# Patient Record
Sex: Male | Born: 1995 | Race: Black or African American | Hispanic: No | Marital: Single | Smoking: Never smoker
Health system: Southern US, Community
[De-identification: ages and names within clinical notes are randomized; demographics above are authoritative.]

## PROBLEM LIST (undated history)

## (undated) HISTORY — PX: ARTHROSCOPIC REPAIR ACL: SUR80

---
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
## 2018-03-08 ENCOUNTER — Emergency Department
Admission: EM | Admit: 2018-03-08 | Discharge: 2018-03-08 | Disposition: A | Discharge status: Home / Self Care | Payer: BLUE CROSS/BLUE SHIELD | Attending: Emergency Medicine | Admitting: Emergency Medicine

## 2018-03-08 ENCOUNTER — Other Ambulatory Visit: Payer: Self-pay

## 2018-03-08 DIAGNOSIS — Z79899 Other long term (current) drug therapy: Secondary | ICD-10-CM | POA: Insufficient documentation

## 2018-03-08 DIAGNOSIS — J069 Acute upper respiratory infection, unspecified: Secondary | ICD-10-CM | POA: Insufficient documentation

## 2018-03-08 MED ORDER — IBUPROFEN 400 MG PO TABS: 400.0000 mg | ORAL_TABLET | Freq: Four times a day (QID) | ORAL | 0 refills | Status: DC | PRN

## 2018-03-08 MED ORDER — PSEUDOEPH-BROMPHEN-DM 30-2-10 MG/5ML PO SYRP: 10.0000 mL | ORAL_SOLUTION | Freq: Four times a day (QID) | ORAL | 0 refills | Status: DC | PRN

## 2018-03-08 NOTE — ED Notes (Signed)
Pt reports headache and runny nose for 2-3 days; pt in no acute distress; ambulatory with steady gait

## 2018-03-08 NOTE — Discharge Instructions (Signed)
Follow up with the primary care provider of your choice if not improving over the next few days. Return to the ER for symptoms that change or worsen if unable to schedule an appointment.

## 2018-03-08 NOTE — ED Notes (Signed)
Pt c/o not feeling well x3 days with a sore throat, n/v and generalized body aches. Pt reports 1 episode of emesis in the last 24 hours. Pt states he has been coughing up yellow sputum. Pt denies any diarrhea.

## 2018-03-08 NOTE — ED Triage Notes (Signed)
Pt states "I have a cold and my head hurts". Pt states has had a runny nose and one episode of emesis today. Pt appears in no acute distress. No diarrhea per pt. Pt states he took a benadryl today at unknown time.

## 2018-03-08 NOTE — ED Provider Notes (Signed)
[facility] Emergency Department Provider Note  ____________________________________________  Time seen: Approximately 7:51 PM  I have reviewed the triage vital signs and the nursing notes.   HISTORY  Chief Complaint URI and Headache   HPI Larry Walton is a 22 y.o. male who presents to the emergency department for treatment of cold symptoms and headache.  Symptoms started 2 days ago.  He has taken Benadryl without relief.  No past medical history on file.  There are no active problems to display for this patient.   Past Surgical History:  Procedure Laterality Date  . ARTHROSCOPIC REPAIR ACL     left    Prior to Admission medications   Medication Sig Start Date End Date Taking? Authorizing Provider  brompheniramine-pseudoephedrine-DM 30-2-10 MG/5ML syrup Take 10 mLs by mouth 4 (four) times daily as needed. 03/08/18   Dorinda Stehr B, FNP  ibuprofen (ADVIL,MOTRIN) 400 MG tablet Take 1 tablet (400 mg total) by mouth every 6 (six) hours as needed. 03/08/18   Teofilo Lupinacci B, FNP  predniSONE (DELTASONE) 50 MG tablet Take 1 tablet (50 mg total) by mouth daily. 08/06/15   Isa Rankin, MD    Allergies Patient has no known allergies.  No family history on file.  Social History Social History   Tobacco Use  . Smoking status: Never Smoker  Substance Use Topics  . Alcohol use: No  . Drug use: No    Review of Systems Constitutional: Negative for fever/chills ENT: Positive for sore throat. Cardiovascular: Denies chest pain. Respiratory: Negative for shortness of breath.  Positive for cough. Gastrointestinal: Positive nausea, one episode of vomiting.  No diarrhea.  Musculoskeletal: Negative for body aches Skin: Negative for rash. Neurological: Positive for headaches ____________________________________________   PHYSICAL EXAM:  VITAL SIGNS: ED Triage Vitals [03/08/18 1929]  Enc Vitals Group     BP 128/75     Pulse Rate 78     Resp 16    Temp 98.6 F (37 C)     Temp Source Oral     SpO2 100 %     Weight 160 lb (72.6 kg)     Height  (1.753 m)     Head Circumference      Peak Flow      Pain Score 2     Pain Loc      Pain Edu?      Excl. in GC?     Constitutional: Alert and oriented.  Acutely ill appearing and in no acute distress. Eyes: Conjunctivae are normal. EOMI. Ears: Bilateral tympanic membranes are normal Nose: Sinus congestion noted; clear rhinnorhea. Mouth/Throat: Mucous membranes are moist.  Oropharynx mildly erythematous. Tonsils flat. Neck: No stridor.  Lymphatic: No cervical lymphadenopathy. Cardiovascular: Normal rate, regular rhythm. Good peripheral circulation. Respiratory: Normal respiratory effort.  No retractions.  Breath sounds clear to auscultation throughout. Gastrointestinal: Soft and nontender.  Musculoskeletal: FROM x 4 extremities.  Neurologic:  Normal speech and language.  Skin:  Skin is warm, dry and intact. No rash noted. Psychiatric: Mood and affect are normal. Speech and behavior are normal.  ____________________________________________   LABS (all labs ordered are listed, but only abnormal results are displayed)  Labs Reviewed - No data to display ____________________________________________  EKG  Not indicated ____________________________________________  RADIOLOGY  None indicated ____________________________________________   PROCEDURES  Procedure(s) performed: None  Critical Care performed: No ____________________________________________   INITIAL IMPRESSION / ASSESSMENT AND PLAN / ED COURSE  22 y.o. male who presents to the emergency  department for treatment and evaluation of symptoms and exam most consistent with a viral upper respiratory infection.  He was given prescriptions for ibuprofen and Bromfed.  He is instructed to follow-up with the primary care provider of his choice for symptoms that are not improving over the next few days.  Work note was  provided for today and tomorrow as well.   He was encouraged to return to the ER for symptoms that change or worsen if unable to schedule an appointment.  Medications - No data to display  ED Discharge Orders        Ordered    ibuprofen (ADVIL,MOTRIN) 400 MG tablet  Every 6 hours PRN     03/08/18 2011    brompheniramine-pseudoephedrine-DM 30-2-10 MG/5ML syrup  4 times daily PRN     03/08/18 2011       Pertinent labs & imaging results that were available during my care of the patient were reviewed by me and considered in my medical decision making (see chart for details).    If controlled substance prescribed during this visit, 12 month history viewed on the [facility] prior to issuing an initial prescription for Schedule II or III opiod. ____________________________________________   FINAL CLINICAL IMPRESSION(S) / ED DIAGNOSES  Final diagnoses:  Viral upper respiratory tract infection    Note:  This document was prepared using Dragon voice recognition software and may include unintentional dictation errors.     Chinita Pester, FNP 03/08/18 2044    Sharyn Creamer, MD 03/09/18 [phone]

## 2018-08-04 ENCOUNTER — Other Ambulatory Visit: Payer: Self-pay

## 2018-08-04 ENCOUNTER — Encounter: Payer: Self-pay | Admitting: Emergency Medicine

## 2018-08-04 ENCOUNTER — Emergency Department
Admission: EM | Admit: 2018-08-04 | Discharge: 2018-08-05 | Disposition: A | Discharge status: Other Acute Inpatient Hospital | Payer: Self-pay | Attending: Emergency Medicine | Admitting: Emergency Medicine

## 2018-08-04 DIAGNOSIS — F29 Unspecified psychosis not due to a substance or known physiological condition: Secondary | ICD-10-CM | POA: Insufficient documentation

## 2018-08-04 LAB — COMPREHENSIVE METABOLIC PANEL
ALT: 113 U/L — ABNORMAL HIGH (ref 0–44)
AST: 215 U/L — ABNORMAL HIGH (ref 15–41)
Albumin: 4.2 g/dL (ref 3.5–5.0)
Alkaline Phosphatase: 48 U/L (ref 38–126)
Anion gap: 9 (ref 5–15)
BILIRUBIN TOTAL: 1 mg/dL (ref 0.3–1.2)
BUN: 19 mg/dL (ref 6–20)
CHLORIDE: 107 mmol/L (ref 98–111)
CO2: 26 mmol/L (ref 22–32)
CREATININE: 1.41 mg/dL — AB (ref 0.61–1.24)
Calcium: 9 mg/dL (ref 8.9–10.3)
Glucose, Bld: 96 mg/dL (ref 70–99)
Potassium: 4 mmol/L (ref 3.5–5.1)
Sodium: 142 mmol/L (ref 135–145)
TOTAL PROTEIN: 6.7 g/dL (ref 6.5–8.1)

## 2018-08-04 LAB — CBC WITH DIFFERENTIAL/PLATELET
Basophils Absolute: 0 10*3/uL (ref 0–0.1)
Basophils Relative: 0 %
EOS ABS: 0 10*3/uL (ref 0–0.7)
EOS PCT: 0 %
HCT: 38.3 % — ABNORMAL LOW (ref 40.0–52.0)
HEMOGLOBIN: 12.6 g/dL — AB (ref 13.0–18.0)
Lymphocytes Relative: 14 %
Lymphs Abs: 1 10*3/uL (ref 1.0–3.6)
MCH: 27.9 pg (ref 26.0–34.0)
MCHC: 33 g/dL (ref 32.0–36.0)
MCV: 84.5 fL (ref 80.0–100.0)
MONOS PCT: 8 %
Monocytes Absolute: 0.6 10*3/uL (ref 0.2–1.0)
NEUTROS PCT: 78 %
Neutro Abs: 5.8 10*3/uL (ref 1.4–6.5)
PLATELETS: 171 10*3/uL (ref 150–440)
RBC: 4.53 MIL/uL (ref 4.40–5.90)
RDW: 13.9 % (ref 11.5–14.5)
WBC: 7.5 10*3/uL (ref 3.8–10.6)

## 2018-08-04 LAB — URINE DRUG SCREEN, QUALITATIVE (ARMC ONLY)
AMPHETAMINES, UR SCREEN: NOT DETECTED
BARBITURATES, UR SCREEN: NOT DETECTED
Benzodiazepine, Ur Scrn: NOT DETECTED
Cannabinoid 50 Ng, Ur ~~LOC~~: POSITIVE — AB
Cocaine Metabolite,Ur ~~LOC~~: NOT DETECTED
MDMA (Ecstasy)Ur Screen: NOT DETECTED
METHADONE SCREEN, URINE: NOT DETECTED
OPIATE, UR SCREEN: NOT DETECTED
Phencyclidine (PCP) Ur S: NOT DETECTED
Tricyclic, Ur Screen: NOT DETECTED

## 2018-08-04 LAB — SALICYLATE LEVEL: Salicylate Lvl: <7 mg/dL (ref 2.8–30.0)

## 2018-08-04 LAB — URINALYSIS, COMPLETE (UACMP) WITH MICROSCOPIC
BILIRUBIN URINE: NEGATIVE
Bacteria, UA: NONE SEEN
Glucose, UA: NEGATIVE mg/dL
Hgb urine dipstick: NEGATIVE
Ketones, ur: NEGATIVE mg/dL
Leukocytes, UA: NEGATIVE
Nitrite: NEGATIVE
Protein, ur: NEGATIVE mg/dL
Specific Gravity, Urine: 1.021 (ref 1.005–1.030)
pH: 6 (ref 5.0–8.0)

## 2018-08-04 LAB — ETHANOL: Alcohol, Ethyl (B): <10 mg/dL (ref <10)

## 2018-08-04 LAB — ACETAMINOPHEN LEVEL

## 2018-08-04 MED ORDER — LORAZEPAM 2 MG/ML IJ SOLN
2.0000 mg | Freq: Once | INTRAMUSCULAR | Status: AC
  Administered 2018-08-04: 2 mg via INTRAMUSCULAR

## 2018-08-04 MED ORDER — HALOPERIDOL LACTATE 5 MG/ML IJ SOLN
INTRAMUSCULAR | Status: AC
  Administered 2018-08-04: 5 mg via INTRAMUSCULAR
  Filled 2018-08-04: qty 1

## 2018-08-04 MED ORDER — LIDOCAINE HCL URETHRAL/MUCOSAL 2 % EX GEL
CUTANEOUS | Status: AC
  Filled 2018-08-04: qty 10

## 2018-08-04 MED ORDER — HALOPERIDOL LACTATE 5 MG/ML IJ SOLN
5.0000 mg | Freq: Once | INTRAMUSCULAR | Status: AC
  Administered 2018-08-04: 5 mg via INTRAMUSCULAR

## 2018-08-04 MED ORDER — LORAZEPAM 2 MG/ML IJ SOLN
INTRAMUSCULAR | Status: AC
  Administered 2018-08-04: 2 mg via INTRAMUSCULAR
  Filled 2018-08-04: qty 1

## 2018-08-04 NOTE — ED Notes (Signed)
Patient belongings black book-bag, pair of [employer] sandals, yellow shorts, navy blue shirt, bag of hygiene (patient has 4 bags total in the [employer])

## 2018-08-04 NOTE — ED Notes (Signed)
TTS at bedside. 

## 2018-08-04 NOTE — ED Notes (Signed)
TTS attempted to conduct assessment now that he is awake.  Larry Walton was incoherent and tried to answer questions, but was unable to complete his thoughts, rambled to other topices and did not appear able to follow the thread of conversation.

## 2018-08-04 NOTE — ED Notes (Signed)
Received iphone cell phone, brown wallet, black bracelet  (bag 2 of 2)

## 2018-08-04 NOTE — ED Notes (Signed)
Pt. Transferred to BHU from ED to room after screening for contraband. Report to include Situation, Background, Assessment and Recommendations from [facility]. Pt. Oriented to unit including Q15 minute rounds as well as the security cameras for their protection. Patient is alert and oriented, warm and dry in no acute distress. Patient denies SI, HI, and AH. Patient states he sees "spiders". Pt. Encouraged to let me know if needs arise.

## 2018-08-04 NOTE — ED Notes (Signed)
Patient able to get up and go to restroom and give a urine sample. Patient appropriate and cooperative

## 2018-08-04 NOTE — ED Notes (Addendum)
Patient given Ativan 2mg  IM, Haldol 5mg  IM patient came in with police handcuffed hyperactive and bizarre behavior. When questioned what is going on today states he can "see into another space". Flight of ideas. Patient keeps attempting to get up and leave.

## 2018-08-04 NOTE — ED Notes (Signed)
Patient belongings are black shorts, one pair black socks, black underwear placed in BHU closet

## 2018-08-04 NOTE — ED Notes (Signed)
Unable to assess patient for any medical issues due to bizarre behavior

## 2018-08-04 NOTE — ED Provider Notes (Addendum)
[facility] Emergency Department Provider Note   ____________________________________________   None    (approximate)  I have reviewed the triage vital signs and the nursing notes.   HISTORY  Chief Complaint Medical Clearance  Chief complaint is acting weird HPI Larry Walton Larry Walton Walton is a 22 y.o. male she brought in by police for usual activity.  He is tickling himself and and talking to himself like there are 2 people in his body for the police here in the emergency room the patient is still talking to himself as though there were 2 people in his body.  He is holding his breath repeatedly for long periods of time but he will not follow commands he does not make any sense when he responds to my questions and occasionally he will talking gibberish.  He is responding to internal stimuli.  I cannot get a history out of him.  Only thing that he will say that I can understand is that he does not smoke cigarettes.  The way he says it implies he smokes other things.  History reviewed. No pertinent past medical history.  Patient Active Problem List   Diagnosis Date Noted  . Severe manic bipolar 1 disorder with psychotic behavior (HCC) 08/06/2018    Past Surgical History:  Procedure Laterality Date  . ARTHROSCOPIC REPAIR ACL     left    Prior to Admission medications   Medication Sig Start Date End Date Taking? Authorizing Provider  brompheniramine-pseudoephedrine-DM 30-2-10 MG/5ML syrup Take 10 mLs by mouth 4 (four) times daily as needed. Patient not taking: Reported on 08/04/2018 03/08/18   Kem Boroughs B, FNP  ibuprofen (ADVIL,MOTRIN) 400 MG tablet Take 1 tablet (400 mg total) by mouth every 6 (six) hours as needed. Patient not taking: Reported on 08/04/2018 03/08/18   Kem Boroughs B, FNP  predniSONE (DELTASONE) 50 MG tablet Take 1 tablet (50 mg total) by mouth daily. Patient not taking: Reported on 08/04/2018 08/06/15   Isa Rankin, MD    Allergies Patient  has no known allergies.  No family history on file.  Social History Social History   Tobacco Use  . Smoking status: Never Smoker  . Smokeless tobacco: Never Used  Substance Use Topics  . Alcohol use: No  . Drug use: No    Review of Systems Unable to obtain  ____________________________________________   PHYSICAL EXAM:  VITAL SIGNS: ED Triage Vitals  Enc Vitals Group     BP      Pulse      Resp      Temp      Temp src      SpO2      Weight      Height      Head Circumference      Peak Flow      Pain Score      Pain Loc      Pain Edu?      Excl. in GC?     Constitutional: Alert looks healthy but does not make any sense when he speaks Eyes: Conjunctivae are normal. PER. EOMI. Head: Atraumatic. Nose: No congestion/rhinnorhea. Mouth/Throat: Mucous membranes are moist.  Oropharynx non-erythematous. Neck: No stridor. Cardiovascular: Normal rate, regular rhythm. Grossly normal heart sounds.  Good peripheral circulation. Respiratory: Normal respiratory effort.  No retractions. Lungs CTAB. Gastrointestinal: Soft and nontender. No distention. No abdominal bruits. No CVA tenderness. Musculoskeletal: No lower extremity tenderness nor edema.  . Neurologic: Is all extremities equally and well does not  make any sense most of the time he is talking.  He does say he does not smoke cigarettes as though he smokes something else he will not see if he vapes or uses any other drugs or medications he will not say if he has any medical problems he is holding his breath intermittently and then breathing very fast when he can hold it any longer he is moving irregularly in the chair but purposefully Skin:  Skin is warm, dry and intact. No rash noted.   ____________________________________________   LABS (all labs ordered are listed, but only abnormal results are displayed)  Labs Reviewed  URINALYSIS, COMPLETE (UACMP) WITH MICROSCOPIC - Abnormal; Notable for the following components:       Result Value   Color, Urine YELLOW (*)    APPearance CLEAR (*)    All other components within normal limits  URINE DRUG SCREEN, QUALITATIVE ([facility] ONLY) - Abnormal; Notable for the following components:   Cannabinoid 50 Ng, Ur [name] POSITIVE (*)    All other components within normal limits  COMPREHENSIVE METABOLIC PANEL - Abnormal; Notable for the following components:   Creatinine, Ser 1.41 (*)    AST 215 (*)    ALT 113 (*)    All other components within normal limits  ACETAMINOPHEN LEVEL - Abnormal; Notable for the following components:   Acetaminophen (Tylenol), Serum <10 (*)    All other components within normal limits  CBC WITH DIFFERENTIAL/PLATELET - Abnormal; Notable for the following components:   Hemoglobin 12.6 (*)    HCT 38.3 (*)    All other components within normal limits  ETHANOL  SALICYLATE LEVEL   ____________________________________________  EKG EKG read and interpreted by me shows sinus bradycardia rate of 52 normal axis no other acute changes seen ________________________________________  RADIOLOGY  ED MD interpretation:    Official radiology report(s): No results found.  ____________________________________________   PROCEDURES  Procedure(s) performed:   Procedures  Critical Care performed:   ____________________________________________   INITIAL IMPRESSION / ASSESSMENT AND PLAN / ED COURSE  Patient with psychosis.  Seems to be acute.  He has a drug screen positive for marijuana.  This may be the cause of the whole problem.  Otherwise he is medically clear.  Psychiatry sees him and will admit.         ____________________________________________   FINAL CLINICAL IMPRESSION(S) / ED DIAGNOSES  Final diagnoses:  Psychosis, unspecified psychosis type [facility])     ED Discharge Orders    None       Note:  This document was prepared using Dragon voice recognition software and may include unintentional dictation errors.     Arnaldo Natal, MD 08/08/18 1439    Arnaldo Natal, MD 08/19/18 [phone]

## 2018-08-04 NOTE — ED Triage Notes (Signed)
Arrives with police, hyperactive and bizarre behavior. When questioned what is going on today states he can "see into another space". Flight of ideas. Does not answer if he has been using recreational drugs.

## 2018-08-04 NOTE — BH Assessment (Signed)
Pt not medically cleared for assessment.

## 2018-08-05 ENCOUNTER — Inpatient Hospital Stay
Admission: AD | Admit: 2018-08-05 | Discharge: 2018-08-14 | DRG: 885 | Disposition: A | Discharge status: Home / Self Care | Payer: BLUE CROSS/BLUE SHIELD | Source: Intra-hospital | Attending: Psychiatry | Admitting: Psychiatry

## 2018-08-05 ENCOUNTER — Other Ambulatory Visit: Payer: Self-pay

## 2018-08-05 DIAGNOSIS — R748 Abnormal levels of other serum enzymes: Secondary | ICD-10-CM | POA: Diagnosis present

## 2018-08-05 DIAGNOSIS — F1210 Cannabis abuse, uncomplicated: Secondary | ICD-10-CM | POA: Diagnosis present

## 2018-08-05 DIAGNOSIS — F259 Schizoaffective disorder, unspecified: Secondary | ICD-10-CM

## 2018-08-05 DIAGNOSIS — Z5181 Encounter for therapeutic drug level monitoring: Secondary | ICD-10-CM | POA: Diagnosis not present

## 2018-08-05 DIAGNOSIS — F312 Bipolar disorder, current episode manic severe with psychotic features: Secondary | ICD-10-CM | POA: Diagnosis not present

## 2018-08-05 DIAGNOSIS — R4689 Other symptoms and signs involving appearance and behavior: Secondary | ICD-10-CM | POA: Diagnosis present

## 2018-08-05 DIAGNOSIS — Z590 Homelessness: Secondary | ICD-10-CM | POA: Diagnosis not present

## 2018-08-05 DIAGNOSIS — R4587 Impulsiveness: Secondary | ICD-10-CM | POA: Diagnosis present

## 2018-08-05 DIAGNOSIS — F250 Schizoaffective disorder, bipolar type: Secondary | ICD-10-CM | POA: Diagnosis not present

## 2018-08-05 DIAGNOSIS — F2081 Schizophreniform disorder: Secondary | ICD-10-CM | POA: Diagnosis present

## 2018-08-05 MED ORDER — HYDROXYZINE HCL 50 MG PO TABS
50.0000 mg | ORAL_TABLET | Freq: Three times a day (TID) | ORAL | Status: DC | PRN
  Administered 2018-08-09: 50 mg via ORAL
  Filled 2018-08-05 (×3): qty 1

## 2018-08-05 MED ORDER — RISPERIDONE 1 MG PO TABS
1.0000 mg | ORAL_TABLET | Freq: Every day | ORAL | Status: DC
  Administered 2018-08-05: 1 mg via ORAL
  Filled 2018-08-05: qty 1

## 2018-08-05 MED ORDER — ACETAMINOPHEN 325 MG PO TABS
650.0000 mg | ORAL_TABLET | Freq: Four times a day (QID) | ORAL | Status: DC | PRN
  Administered 2018-08-09: 650 mg via ORAL
  Filled 2018-08-05: qty 2

## 2018-08-05 MED ORDER — ALUM & MAG HYDROXIDE-SIMETH 200-200-20 MG/5ML PO SUSP: 30.0000 mL | ORAL | Status: DC | PRN

## 2018-08-05 MED ORDER — MAGNESIUM HYDROXIDE 400 MG/5ML PO SUSP: 30.0000 mL | Freq: Every day | ORAL | Status: DC | PRN

## 2018-08-05 MED ORDER — TRAZODONE HCL 100 MG PO TABS
100.0000 mg | ORAL_TABLET | Freq: Every evening | ORAL | Status: DC | PRN
  Administered 2018-08-05 – 2018-08-10 (×3): 100 mg via ORAL
  Filled 2018-08-05 (×4): qty 1

## 2018-08-05 NOTE — Tx Team (Signed)
Initial Treatment Plan 08/05/2018 3:27 PM Larry Walton [id number]    PATIENT STRESSORS: Financial difficulties Health problems Substance abuse   PATIENT STRENGTHS: Ability for insight Capable of independent living Communication skills   PATIENT IDENTIFIED PROBLEMS:  Psychotic 08/05/18  Homeless 08/05/18  Substance Abuse 08/05/18                 DISCHARGE CRITERIA:  Ability to meet basic life and health needs Improved stabilization in mood, thinking, and/or behavior  PRELIMINARY DISCHARGE PLAN: Outpatient therapy Return to previous living arrangement  PATIENT/FAMILY INVOLVEMENT: This treatment plan has been presented to and reviewed with the patient, [name], and/or family member,  .  The patient and family have been given the opportunity to ask questions and make suggestions.  Crist Infante, RN 08/05/2018, 3:27 PM

## 2018-08-05 NOTE — ED Notes (Signed)
Hourly rounding reveals patient sleeping in room. No complaints, stable, in no acute distress. Q15 minute rounds and monitoring via Security Cameras to continue. 

## 2018-08-05 NOTE — Plan of Care (Signed)
New admission   patient unable to completely  understand information received  will continue to redirect   Problem: Education: Goal: Knowledge of [facility] Education information/materials will improve Outcome: Progressing Goal: Emotional status will improve Outcome: Progressing Goal: Mental status will improve Outcome: Progressing Goal: Verbalization of understanding the information provided will improve Outcome: Progressing   Problem: Coping: Goal: Coping ability will improve Outcome: Progressing Goal: Will verbalize feelings Outcome: Progressing   Problem: Safety: Goal: Ability to redirect hostility and anger into socially appropriate behaviors will improve Outcome: Progressing Goal: Ability to remain free from injury will improve Outcome: Progressing
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
## 2018-08-05 NOTE — ED Notes (Addendum)
Patient is being transferred to [facility] room 305A. Patient and staff received belongings and verbalized he has received all of his belongings. Patient appropriate and cooperative, Denies SI/HI AVH. Vital signs taken. NAD noted.

## 2018-08-05 NOTE — ED Provider Notes (Signed)
-----------------------------------------   6:32 AM on 08/05/2018 -----------------------------------------   Blood pressure (!) 104/51, pulse 63, temperature 98.7 F (37.1 C), temperature source Oral, resp. rate 18, height 1.778 m (5\' 10" ), weight 74.8 kg, SpO2 100 %.  The patient had no acute events since last update.  Psychiatric/TTS consult has not yet been performed because the patient was uncooperative.   Loleta Rose, MD 08/05/18 [phone]

## 2018-08-05 NOTE — ED Notes (Signed)
Writer attempted to speak with patient, patient continues to be somewhat incoherent and attempted to answer questions, but was unable to complete his thoughts let him know he will talk with the psychiatrist.

## 2018-08-05 NOTE — Consult Note (Signed)
[facility] Face-to-Face Psychiatry Consult   Reason for Consult: Consult for this 22 year old man who was brought in by police after being found acting bizarrely in public Referring Physician: Alfred Levins Patient Identification: Larry Walton MRN:  [id number] Principal Diagnosis: Schizophreniform disorder [facility]) Diagnosis:   Patient Active Problem List   Diagnosis Date Noted  . Schizophreniform disorder ([place]) [F20.81] 08/05/2018    Total Time spent with patient: 1 hour  Subjective:   Larry Walton is a 22 y.o. male patient admitted with "the police brought me".  HPI: Patient interviewed chart reviewed.  22 year old man brought in by police with reports that he had been acting bizarrely in public.  The patient says that he was at the [facility] and that he had no shirt on and that he felt like there was a spider on him.  That makes it sound more organized than he actually told it which was very disorganized and difficult to make much sense of.  Patient seems confused and did not really understand what is going on around him.  He says he has been staying at the shelter for the past 2 weeks and prior to that had been staying with his mother.  Denies that he is using any alcohol or drugs.  Says that he only drinks "occasionally" but then clarifies that he is not even talking about alcohol.  Patient is vague when asked about hallucinations ultimately denies it but seems to be on the fence about it.  A lot of his speech is disorganized rambling and very soft.  Social history: Not much information available.  Apparently staying at the shelter for the last couple weeks.  Not working right now.  Michela Pitcher that he been looking for work.  Medical history: Does not know of any medical problems  Substance abuse history: Denies alcohol or drug abuse history no findings on the alcohol or drug screen  Past Psychiatric History: Patient denies having any psychiatric history as an adult.  He says that when he was in second grade he saw a  psychiatrist.  He is very vague about what that was for.  Denies ever having tried to kill himself in the past or having any history of violence.  Risk to Self:   Risk to Others:   Prior Inpatient Therapy:   Prior Outpatient Therapy:    Past Medical History: No past medical history on file.  Past Surgical History:  Procedure Laterality Date  . ARTHROSCOPIC REPAIR ACL     left   Family History: No family history on file. Family Psychiatric  History: Does not know of any family history Social History:  Social History   Substance and Sexual Activity  Alcohol Use No     Social History   Substance and Sexual Activity  Drug Use No    Social History   Socioeconomic History  . Marital status: Single    Spouse name: Not on file  . Number of children: Not on file  . Years of education: Not on file  . Highest education level: Not on file  Occupational History  . Not on file  Social Needs  . Financial resource strain: Not on file  . Food insecurity:    Worry: Not on file    Inability: Not on file  . Transportation needs:    Medical: Not on file    Non-medical: Not on file  Tobacco Use  . Smoking status: Never Smoker  Substance and Sexual Activity  . Alcohol use: No  . Drug  use: No  . Sexual activity: Not on file  Lifestyle  . Physical activity:    Days per week: Not on file    Minutes per session: Not on file  . Stress: Not on file  Relationships  . Social connections:    Talks on phone: Not on file    Gets together: Not on file    Attends religious service: Not on file    Active member of club or organization: Not on file    Attends meetings of clubs or organizations: Not on file    Relationship status: Not on file  Other Topics Concern  . Not on file  Social History Narrative  . Not on file   Additional Social History:    Allergies:  No Known Allergies  Labs:  Results for orders placed or performed during the hospital encounter of 08/04/18 (from the  past 48 hour(s))  Urinalysis, Complete w Microscopic     Status: Abnormal   Collection Time: 08/04/18  2:37 PM  Result Value Ref Range   Color, Urine YELLOW (A) YELLOW   APPearance CLEAR (A) CLEAR   Specific Gravity, Urine 1.021 1.005 - 1.030   pH 6.0 5.0 - 8.0   Glucose, UA NEGATIVE NEGATIVE mg/dL   Hgb urine dipstick NEGATIVE NEGATIVE   Bilirubin Urine NEGATIVE NEGATIVE   Ketones, ur NEGATIVE NEGATIVE mg/dL   Protein, ur NEGATIVE NEGATIVE mg/dL   Nitrite NEGATIVE NEGATIVE   Leukocytes, UA NEGATIVE NEGATIVE   RBC / HPF 0-5 0 - 5 RBC/hpf   WBC, UA 0-5 0 - 5 WBC/hpf   Bacteria, UA NONE SEEN NONE SEEN   Squamous Epithelial / LPF 0-5 0 - 5   Mucus PRESENT    Hyaline Casts, UA PRESENT     Comment: Performed at [facility], [address] [place], [place] [address]  Urine Drug Screen, Qualitative     Status: Abnormal   Collection Time: 08/04/18  2:37 PM  Result Value Ref Range   Tricyclic, Ur Screen NONE DETECTED NONE DETECTED   Amphetamines, Ur Screen NONE DETECTED NONE DETECTED   MDMA (Ecstasy)Ur Screen NONE DETECTED NONE DETECTED   Cocaine Metabolite,Ur [place] NONE DETECTED NONE DETECTED   Opiate, Ur Screen NONE DETECTED NONE DETECTED   Phencyclidine (PCP) Ur S NONE DETECTED NONE DETECTED   Cannabinoid 50 Ng, Ur [place] POSITIVE (A) NONE DETECTED   Barbiturates, Ur Screen NONE DETECTED NONE DETECTED   Benzodiazepine, Ur Scrn NONE DETECTED NONE DETECTED   Methadone Scn, Ur NONE DETECTED NONE DETECTED    Comment: (NOTE) Tricyclics + metabolites, urine    Cutoff 1000 ng/mL Amphetamines + metabolites, urine  Cutoff 1000 ng/mL MDMA (Ecstasy), urine              Cutoff 500 ng/mL Cocaine Metabolite, urine          Cutoff 300 ng/mL Opiate + metabolites, urine        Cutoff 300 ng/mL Phencyclidine (PCP), urine         Cutoff 25 ng/mL Cannabinoid, urine                 Cutoff 50 ng/mL Barbiturates + metabolites, urine  Cutoff 200 ng/mL Benzodiazepine, urine              Cutoff 200  ng/mL Methadone, urine                   Cutoff 300 ng/mL The urine drug screen provides only a preliminary, unconfirmed  analytical test result and should not be used for non-medical purposes. Clinical consideration and professional judgment should be applied to any positive drug screen result due to possible interfering substances. A more specific alternate chemical method must be used in order to obtain a confirmed analytical result. Gas chromatography / mass spectrometry (GC/MS) is the preferred confirmat ory method. Performed at [facility], [address] [place], [place] [address]   Comprehensive metabolic panel     Status: Abnormal   Collection Time: 08/04/18  2:37 PM  Result Value Ref Range   Sodium 142 135 - 145 mmol/L   Potassium 4.0 3.5 - 5.1 mmol/L   Chloride 107 98 - 111 mmol/L   CO2 26 22 - 32 mmol/L   Glucose, Bld 96 70 - 99 mg/dL   BUN 19 6 - 20 mg/dL   Creatinine, Ser 1.41 (H) 0.61 - 1.24 mg/dL   Calcium 9.0 8.9 - 10.3 mg/dL   Total Protein 6.7 6.5 - 8.1 g/dL   Albumin 4.2 3.5 - 5.0 g/dL   AST 215 (H) 15 - 41 U/L   ALT 113 (H) 0 - 44 U/L   Alkaline Phosphatase 48 38 - 126 U/L   Total Bilirubin 1.0 0.3 - 1.2 mg/dL   GFR calc non Af Amer >60 >60 mL/min   GFR calc Af Amer >60 >60 mL/min    Comment: (NOTE) The eGFR has been calculated using the CKD EPI equation. This calculation has not been validated in all clinical situations. eGFR's persistently <60 mL/min signify possible Chronic Kidney Disease.    Anion gap 9 5 - 15    Comment: Performed at [facility], [place], [place] [address]  Acetaminophen level     Status: Abnormal   Collection Time: 08/04/18  2:37 PM  Result Value Ref Range   Acetaminophen (Tylenol), Serum <10 (L) 10 - 30 ug/mL    Comment: (NOTE) Therapeutic concentrations vary significantly. A range of 10-30 ug/mL  may be an effective concentration for many patients. However, some  are best treated at  concentrations outside of this range. Acetaminophen concentrations >150 ug/mL at 4 hours after ingestion  and >50 ug/mL at 12 hours after ingestion are often associated with  toxic reactions. Performed at [facility], [address] [place], [place] [address]   Ethanol     Status: None   Collection Time: 08/04/18  2:37 PM  Result Value Ref Range   Alcohol, Ethyl (B) <10 <10 mg/dL    Comment: (NOTE) Lowest detectable limit for serum alcohol is 10 mg/dL. For medical purposes only. Performed at [facility], [address] [place], [place] [address]   Salicylate level     Status: None   Collection Time: 08/04/18  2:37 PM  Result Value Ref Range   Salicylate Lvl <3.4 2.8 - 30.0 mg/dL    Comment: Performed at [facility], [address] [place], [place] [address]  CBC with Differential     Status: Abnormal   Collection Time: 08/04/18  2:37 PM  Result Value Ref Range   WBC 7.5 3.8 - 10.6 K/uL   RBC 4.53 4.40 - 5.90 MIL/uL   Hemoglobin 12.6 (L) 13.0 - 18.0 g/dL   HCT 38.3 (L) 40.0 - 52.0 %   MCV 84.5 80.0 - 100.0 fL   MCH 27.9 26.0 - 34.0 pg   MCHC 33.0 32.0 - 36.0 g/dL   RDW 13.9 11.5 - 14.5 %   Platelets 171  150 - 440 K/uL   Neutrophils Relative % 78 %   Neutro Abs 5.8 1.4 - 6.5 K/uL   Lymphocytes Relative 14 %   Lymphs Abs 1.0 1.0 - 3.6 K/uL   Monocytes Relative 8 %   Monocytes Absolute 0.6 0.2 - 1.0 K/uL   Eosinophils Relative 0 %   Eosinophils Absolute 0.0 0 - 0.7 K/uL   Basophils Relative 0 %   Basophils Absolute 0.0 0 - 0.1 K/uL    Comment: Performed at [facility], [address], [place], [place] [address]    Current Facility-Administered Medications  Medication Dose Route Frequency Provider Last Rate Last Dose  . acetaminophen (TYLENOL) tablet 650 mg  650 mg Oral Q6H PRN Clapacs, John T, MD      . alum & mag hydroxide-simeth (MAALOX/MYLANTA) 200-200-20 MG/5ML suspension 30 mL  30 mL Oral Q4H PRN Clapacs, John T, MD       . hydrOXYzine (ATARAX/VISTARIL) tablet 50 mg  50 mg Oral TID PRN Clapacs, John T, MD      . magnesium hydroxide (MILK OF MAGNESIA) suspension 30 mL  30 mL Oral Daily PRN Clapacs, John T, MD      . risperiDONE (RISPERDAL) tablet 1 mg  1 mg Oral QHS Clapacs, John T, MD      . traZODone (DESYREL) tablet 100 mg  100 mg Oral QHS PRN Clapacs, Madie Reno, MD        Musculoskeletal: Strength & Muscle Tone: within normal limits Gait & Station: normal Patient leans: N/A  Psychiatric Specialty Exam: Physical Exam  Nursing note and vitals reviewed. Constitutional: He appears well-developed and well-nourished.  HENT:  Head: Normocephalic and atraumatic.  Eyes: Pupils are equal, round, and reactive to light. Conjunctivae are normal.  Neck: Normal range of motion.  Cardiovascular: Regular rhythm and normal heart sounds.  Respiratory: Effort normal. No respiratory distress.  GI: Soft.  Musculoskeletal: Normal range of motion.  Neurological: He is alert.  Skin: Skin is warm and dry.  Psychiatric: His affect is blunt. His speech is delayed. He is withdrawn. He is not agitated and not aggressive. Thought content is not paranoid. Cognition and memory are impaired. He expresses impulsivity and inappropriate judgment. He expresses no homicidal and no suicidal ideation. He is noncommunicative. He exhibits abnormal recent memory.    Review of Systems  Constitutional: Negative.   HENT: Negative.   Eyes: Negative.   Respiratory: Negative.   Cardiovascular: Negative.   Gastrointestinal: Negative.   Musculoskeletal: Negative.   Skin: Negative.   Neurological: Negative.   Psychiatric/Behavioral: Positive for hallucinations and memory loss. Negative for depression, substance abuse and suicidal ideas. The patient is nervous/anxious. The patient does not have insomnia.     There were no vitals taken for this visit.There is no height or weight on file to calculate BMI.  General Appearance: Disheveled  Eye  Contact:  Minimal  Speech:  Slow  Volume:  Decreased  Mood:  Euthymic  Affect:  Constricted  Thought Process:  Goal Directed  Orientation:  Full (Time, Place, and Person)  Thought Content:  Illogical and Rumination  Suicidal Thoughts:  No  Homicidal Thoughts:  No  Memory:  Immediate;   Fair Recent;   Poor Remote;   Poor  Judgement:  Impaired  Insight:  Shallow  Psychomotor Activity:  Decreased  Concentration:  Concentration: Fair  Recall:  [employer] of Knowledge:  Fair  Language:  Fair  Akathisia:  No  Handed:  Right  AIMS (  if indicated):     Assets:  Desire for Improvement Physical Health Resilience  ADL's:  Impaired  Cognition:  Impaired,  Mild  Sleep:        Treatment Plan Summary: Daily contact with patient to assess and evaluate symptoms and progress in treatment, Medication management and Plan 22 year old man who was brought in from the outside with bizarre behavior.  Somewhat disorganized bizarre and vaguely endorsing psychotic symptoms.  No evidence of substance abuse.  Although he is not acutely violent he does appear to be having a new psychosis which warrants hospitalization for evaluation and treatment.  Continue involuntary commitment.  Orders completed for admission to the psychiatry ward.  Start Walton-dose Risperdal at night.  Get full set of labs including EKG.  15-minute checks.  Disposition: Recommend psychiatric Inpatient admission when medically cleared. Supportive therapy provided about ongoing stressors.  Alethia Berthold, MD 08/05/2018 4:04 PM

## 2018-08-05 NOTE — BH Assessment (Signed)
Patient is to be admitted to [facility] by Dr. Toni Amend.  Attending Physician will be [name].   Patient has been assigned to room 305, by [facility] Charge Nurse T'Yawn.   ER staff is aware of the admission:  Misty Stanley, ER Secretary    Dr. Alphonzo Lemmings, ER MD   Geralynn Ochs, Patient's Nurse   Ethelene Browns, Patient Access.

## 2018-08-05 NOTE — Progress Notes (Signed)
Admission Note:[facility]  Jadeka RN ER  D: Pt appeared depressed  With  a flat affect.  Pt  denies SI at this time. Patient  Stated he felt the spider on his face  And it was the size of a infant  Patient stated he  Has felt similar instances with climbing building feels he can climb  Sky scrapers . Patient animated during assessment   .  Patient acknowledges he has no family support . Voice of returning to the shelter. Pt is redirectable and cooperative with assessment.      A: Pt admitted to unit per protocol, skin assessment and search done and no contraband found.  Pt  educated on therapeutic milieu rules. Pt was introduced to milieu by nursing staff.    R: Pt was receptive to education about the milieu .  15 min safety checks started. Clinical research associate offered support

## 2018-08-06 DIAGNOSIS — F312 Bipolar disorder, current episode manic severe with psychotic features: Secondary | ICD-10-CM

## 2018-08-06 LAB — COMPREHENSIVE METABOLIC PANEL
ALBUMIN: 5 g/dL (ref 3.5–5.0)
ALT: 109 U/L — ABNORMAL HIGH (ref 0–44)
AST: 137 U/L — ABNORMAL HIGH (ref 15–41)
Alkaline Phosphatase: 63 U/L (ref 38–126)
Anion gap: 8 (ref 5–15)
BILIRUBIN TOTAL: 0.6 mg/dL (ref 0.3–1.2)
BUN: 19 mg/dL (ref 6–20)
CALCIUM: 9.8 mg/dL (ref 8.9–10.3)
CO2: 27 mmol/L (ref 22–32)
Chloride: 104 mmol/L (ref 98–111)
Creatinine, Ser: 1.41 mg/dL — ABNORMAL HIGH (ref 0.61–1.24)
GFR calc Af Amer: >60 mL/min (ref >60)
GFR calc non Af Amer: >60 mL/min (ref >60)
Glucose, Bld: 117 mg/dL — ABNORMAL HIGH (ref 70–99)
Potassium: 4.4 mmol/L (ref 3.5–5.1)
Sodium: 139 mmol/L (ref 135–145)
TOTAL PROTEIN: 8.3 g/dL — AB (ref 6.5–8.1)

## 2018-08-06 LAB — IRON AND TIBC
Iron: 73 ug/dL (ref 45–182)
Saturation Ratios: 18 % (ref 17.9–39.5)
TIBC: 404 ug/dL (ref 250–450)
UIBC: 331 ug/dL

## 2018-08-06 LAB — FERRITIN: Ferritin: 69 ng/mL (ref 24–336)

## 2018-08-06 MED ORDER — RISPERIDONE 1 MG PO TABS
1.0000 mg | ORAL_TABLET | Freq: Two times a day (BID) | ORAL | Status: DC
  Administered 2018-08-06 – 2018-08-07 (×2): 1 mg via ORAL
  Filled 2018-08-06 (×3): qty 1

## 2018-08-06 NOTE — [facility] Suicide Risk Assessment (Signed)
[facility] INPATIENT:  Family/Significant Other Suicide Prevention Education  Suicide Prevention Education:  Education Completed; Mother Oneida Arenas, [phone] has been identified by the patient as the family member/significant other with whom the patient will be residing, and identified as the person(s) who will aid the patient in the event of a mental health crisis (suicidal ideations/suicide attempt).  With written consent from the patient, the family member/significant other has been provided the following suicide prevention education, prior to the and/or following the discharge of the patient.  The suicide prevention education provided includes the following:  Suicide risk factors  Suicide prevention and interventions  National Suicide Hotline telephone number  [facility] assessment telephone number  [facility] Emergency Assistance 911  [facility] and/or Residential Mobile Crisis Unit telephone number  Request made of family/significant other to:  Remove weapons (e.g., guns, rifles, knives), all items previously/currently identified as safety concern.    Remove drugs/medications (over-the-counter, prescriptions, illicit drugs), all items previously/currently identified as a safety concern.  The family member/significant other verbalizes understanding of the suicide prevention education information provided.  The family member/significant other agrees to remove the items of safety concern listed above.  Mother reports that the patient has been hearing voices and she has been trying to get him some help. She says that he has had previous behavioral concerns regarding making assumptions and talking to movie screen at the movies. She reports that these behaviors started in February 2019. She says that he has told her that he has had people talking to him when they are not. She reports that there was an issue at home with them feeling like they were talking on  eggshells. She says that they moved and did not allow him to go with them. He has been living in an shelter for the past 2 weeks. He had to be escorted off the property via officers. Mother reports that he has been using [facility] and thinks that it was laced. She reports that his girlfriend is a trigger for him. She reports that his father had similar mental health problems.   Johny Shears 08/06/2018, 1:24 PM

## 2018-08-06 NOTE — Plan of Care (Signed)
Pt. Verbalizes understanding of provided education. Pt. Denies SI/HI, verbally contracts for safety. Pt. Appropriate during assessments, logical and coherent. Pt. Denies anxiety, depression, and hallucinations this evening.    Problem: Education: Goal: Knowledge of [place] [facility] Education information/materials will improve Outcome: Progressing Goal: Emotional status will improve Outcome: Progressing Goal: Mental status will improve Outcome: Progressing   Problem: Safety: Goal: Ability to remain free from injury will improve Outcome: Progressing   Problem: Safety: Goal: Ability to remain free from injury will improve Outcome: Progressing

## 2018-08-06 NOTE — Consult Note (Signed)
GI Inpatient Consult Note  Reason for Consult: Elevated LFTs   Attending Requesting Consult: Corinna Gab, MD  History of Present Illness: Larry Walton is a 22 y.o. male seen for evaluation of elevated LFTs at the request of Corinna Gab, MD. Pt was brought in by police for bizarre behaviors 09/30. On labs, he was found to have elevated LFTs: AST 215, ALT 113 with normal albumin and bilirubin. Pt was not on any medication prior to admission. He has recently been placed on Risperdal during this hospital admission. Differential includes bipolar I disorder vs Schizophreniform vs cannabis induced psychosis. Pt does have hx of mania.  Pt seen and evaluated today in bedside chair resting comfortably. He is organized and able to give a good history. He has been staying at a shelter for the past two weeks and reports he is happy with the environment there He denies any abdominal pain. No recent changes in his bowel habits. He reports 1-2 BMs daily without any bright red blood or dark, tarry stools. He denies any upper GI symptoms of nausea, vomiting, dysphagia, heartburn or acid reflux, or early satiety. He denies any IVDU. He does smoke marijuana daily. He denies frequent Tylenol use. No frequent alcohol consumption. No recent tattoos or incarceration. No herbal supplements. No known family hx of liver disease. No repeat LFTs since admission.    Past Medical History:  History reviewed. No pertinent past medical history.  Problem List: Patient Active Problem List   Diagnosis Date Noted  . Severe manic bipolar 1 disorder with psychotic behavior (HCC) 08/06/2018    Past Surgical History: Past Surgical History:  Procedure Laterality Date  . ARTHROSCOPIC REPAIR ACL     left    Allergies: No Known Allergies  Home Medications: Medications Prior to Admission  Medication Sig Dispense Refill Last Dose  . brompheniramine-pseudoephedrine-DM 30-2-10 MG/5ML syrup Take 10 mLs by mouth 4 (four) times daily as  needed. (Patient not taking: Reported on 08/04/2018) 120 mL 0 Completed Course at Unknown time  . ibuprofen (ADVIL,MOTRIN) 400 MG tablet Take 1 tablet (400 mg total) by mouth every 6 (six) hours as needed. (Patient not taking: Reported on 08/04/2018) 30 tablet 0 Completed Course at Unknown time  . predniSONE (DELTASONE) 50 MG tablet Take 1 tablet (50 mg total) by mouth daily. (Patient not taking: Reported on 08/04/2018) 5 tablet 0 Completed Course at Unknown time   Home medication reconciliation was completed with the patient.   Scheduled Inpatient Medications:   . risperiDONE  1 mg Oral BID    Continuous Inpatient Infusions:    PRN Inpatient Medications:  acetaminophen, alum & mag hydroxide-simeth, hydrOXYzine, magnesium hydroxide, traZODone  Family History: family history is not on file.  The patient's family history is negative for inflammatory bowel disorders, GI malignancy, or solid organ transplantation.  Social History:   reports that he has never smoked. He has never used smokeless tobacco. He reports that he does not drink alcohol or use drugs. The patient denies ETOH, tobacco, or drug use.   Review of Systems: Constitutional: Weight is stable.  Eyes: No changes in vision. ENT: No oral lesions, sore throat.  GI: see HPI.  Heme/Lymph: No easy bruising.  CV: No chest pain.  GU: No hematuria.  Integumentary: No rashes.  Neuro: No headaches.  Psych: No depression/anxiety.  Endocrine: No heat/cold intolerance.  Allergic/Immunologic: No urticaria.  Resp: No cough, SOB.  Musculoskeletal: No joint swelling.    Physical Examination: BP (!) 106/57 (BP Location: Left Arm)  Pulse (!) 104   Temp 98.3 F (36.8 C) (Oral)   Resp 18   Ht 5\' 10"  (1.778 m)   Wt 70.8 kg   SpO2 97%   BMI 22.38 kg/m  Gen: NAD, alert and oriented to person, place, and time. HEENT: PEERLA, EOMI, Neck: supple, no JVD or thyromegaly Chest: CTA bilaterally, no wheezes, crackles, or other adventitious  sounds CV: RRR, no m/g/c/r Abd: soft, NT, ND, +BS in all four quadrants; no HSM, guarding, ridigity, or rebound tenderness Ext: no edema, well perfused with 2+ pulses, Skin: no rash or lesions noted Lymph: no LAD  Data: Lab Results  Component Value Date   WBC 7.5 08/04/2018   HGB 12.6 (L) 08/04/2018   HCT 38.3 (L) 08/04/2018   MCV 84.5 08/04/2018   PLT 171 08/04/2018   Recent Labs  Lab 08/04/18 1437  HGB 12.6*   Lab Results  Component Value Date   NA 142 08/04/2018   K 4.0 08/04/2018   CL 107 08/04/2018   CO2 26 08/04/2018   BUN 19 08/04/2018   CREATININE 1.41 (H) 08/04/2018   Lab Results  Component Value Date   ALT 113 (H) 08/04/2018   AST 215 (H) 08/04/2018   ALKPHOS 48 08/04/2018   BILITOT 1.0 08/04/2018   No results for input(s): APTT, INR, PTT in the last 168 hours. Assessment/Plan:  [name] is a 22 y/o AA male admitted for bizarre behaviors with a diagnosis of severe manic bipolar I disorder with psychotic behavior seen in consult for elevated LFTs  1. Elevated LFTs: - Unclear etiology at this time. Recent labs show normal acetaminophen, ethanol level. No current GI symptoms of abdominal pain, nausea, vomiting. Risperdal is not causing elevation as he just started this today. - Given psychotic features, Wilson's disease is on the differential. - Recheck LFTs today. Check acute hepatitis panel, ceruloplasmin, ferritin, iron panel - If LFTs remain elevated, pt would benefit from outpatient GI follow-up for further investigation with autoimmune work-up - We will continue to follow along   Thank you for the consult. Please call with questions or concerns.  Gilda Crease, PA-C [facility] GI

## 2018-08-06 NOTE — Progress Notes (Signed)
Recreation Therapy Notes  Date: 08/06/18  Time: 9:30 am   Location: Craft room   Behavioral response: N/A   Intervention Topic: Self-care   Discussion/Intervention: Patient did not attend group.   Clinical Observations/Feedback:  Patient did not attend group.        Hector Venne 08/06/2018 9:54 AM 

## 2018-08-06 NOTE — [facility] Group Notes (Signed)
LCSW Group Therapy Note  08/06/2018 1:00 pm  Type of Therapy/Topic:  Group Therapy:  Emotion Regulation  Participation Level:  Active   Description of Group:    The purpose of this group is to assist patients in learning to regulate negative emotions and experience positive emotions. Patients will be guided to discuss ways in which they have been vulnerable to their negative emotions. These vulnerabilities will be juxtaposed with experiences of positive emotions or situations, and patients will be challenged to use positive emotions to combat negative ones. Special emphasis will be placed on coping with negative emotions in conflict situations, and patients will process healthy conflict resolution skills.  Therapeutic Goals: 1. Patient will identify two positive emotions or experiences to reflect on in order to balance out negative emotions 2. Patient will label two or more emotions that they find the most difficult to experience 3. Patient will demonstrate positive conflict resolution skills through discussion and/or role plays  Summary of Patient Progress: Kolson actively participated in today's group discussion on emotion regulation.  Ibn shared that the feeling of "hurt" is the one emotion that he finds difficult to experience.  York shared that writing things down or running helps him most to balance out negative emotions that he may have.        Therapeutic Modalities:   Cognitive Behavioral Therapy Feelings Identification Dialectical Behavioral Therapy

## 2018-08-06 NOTE — Plan of Care (Signed)
Patient verbalizes understanding of the general information that's been provided to him and has not voiced any further questions or concerns at this time. Patient denies SI/HI/AVH as well as any signs/symptoms of depression and anxiety at this time. Patient has been observed in his room as well as in the dayroom writing, stating to this Clinical research associate that he is making music. Patient has the ability to redirect his hostility/anger into appropriate behaviors. Patient has remained free from injury thus far and is safe on the unit at this time.  Problem: Education: Goal: Knowledge of [facility] Education information/materials will improve Outcome: Progressing Goal: Emotional status will improve Outcome: Progressing Goal: Mental status will improve Outcome: Progressing Goal: Verbalization of understanding the information provided will improve Outcome: Progressing   Problem: Coping: Goal: Coping ability will improve Outcome: Progressing Goal: Will verbalize feelings Outcome: Progressing   Problem: Safety: Goal: Ability to redirect hostility and anger into socially appropriate behaviors will improve Outcome: Progressing Goal: Ability to remain free from injury will improve Outcome: Progressing   Problem: Safety: Goal: Ability to remain free from injury will improve Outcome: Progressing
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
## 2018-08-06 NOTE — [facility] Suicide Risk Assessment (Signed)
[facility] INPATIENT:  Family/Significant Other Suicide Prevention Education  Suicide Prevention Education:  Contact Attempts: Mother Oneida Arenas, [phone] has been identified by the patient as the family member/significant other with whom the patient will be residing, and identified as the person(s) who will aid the patient in the event of a mental health crisis.  With written consent from the patient, two attempts were made to provide suicide prevention education, prior to and/or following the patient's discharge.  We were unsuccessful in providing suicide prevention education.  A suicide education pamphlet was given to the patient to share with family/significant other.  Date and time of first attempt: CSW attempted to contact the patients family member/ significant other to complete SPE and obtain collateral information. CSW left a voicemail with a callback number on 08/06/2018 12:08 PM  Date and time of second attempt:  Johny Shears 08/06/2018, 12:08 PM

## 2018-08-06 NOTE — Tx Team (Addendum)
Interdisciplinary Treatment and Diagnostic Plan Update  08/06/2018 Time of Session: [name] MRN: [id number]  Principal Diagnosis: Schizophreniform disorder [facility])  Secondary Diagnoses: Principal Problem:   Schizophreniform disorder (HCC)   Current Medications:  Current Facility-Administered Medications  Medication Dose Route Frequency Provider Last Rate Last Dose  . acetaminophen (TYLENOL) tablet 650 mg  650 mg Oral Q6H PRN Clapacs, John T, MD      . alum & mag hydroxide-simeth (MAALOX/MYLANTA) 200-200-20 MG/5ML suspension 30 mL  30 mL Oral Q4H PRN Clapacs, John T, MD      . hydrOXYzine (ATARAX/VISTARIL) tablet 50 mg  50 mg Oral TID PRN Clapacs, John T, MD      . magnesium hydroxide (MILK OF MAGNESIA) suspension 30 mL  30 mL Oral Daily PRN Clapacs, John T, MD      . risperiDONE (RISPERDAL) tablet 1 mg  1 mg Oral BID McNew, Holly R, MD      . traZODone (DESYREL) tablet 100 mg  100 mg Oral QHS PRN Clapacs, Jackquline Denmark, MD   100 mg at 08/05/18 2107   PTA Medications: Medications Prior to Admission  Medication Sig Dispense Refill Last Dose  . brompheniramine-pseudoephedrine-DM 30-2-10 MG/5ML syrup Take 10 mLs by mouth 4 (four) times daily as needed. (Patient not taking: Reported on 08/04/2018) 120 mL 0 Completed Course at Unknown time  . ibuprofen (ADVIL,MOTRIN) 400 MG tablet Take 1 tablet (400 mg total) by mouth every 6 (six) hours as needed. (Patient not taking: Reported on 08/04/2018) 30 tablet 0 Completed Course at Unknown time  . predniSONE (DELTASONE) 50 MG tablet Take 1 tablet (50 mg total) by mouth daily. (Patient not taking: Reported on 08/04/2018) 5 tablet 0 Completed Course at Unknown time    Patient Stressors: Financial difficulties Health problems Substance abuse  Patient Strengths: Ability for insight Capable of independent living Communication skills  Treatment Modalities: Medication Management, Group therapy, Case management,  1 to 1 session with clinician,  Psychoeducation, Recreational therapy.   Physician Treatment Plan for Primary Diagnosis: Schizophreniform disorder (HCC) Long Term Goal(s):     Short Term Goals:    Medication Management: Evaluate patient's response, side effects, and tolerance of medication regimen.  Therapeutic Interventions: 1 to 1 sessions, Unit Group sessions and Medication administration.  Evaluation of Outcomes: Progressing  Physician Treatment Plan for Secondary Diagnosis: Principal Problem:   Schizophreniform disorder (HCC)  Long Term Goal(s):     Short Term Goals:       Medication Management: Evaluate patient's response, side effects, and tolerance of medication regimen.  Therapeutic Interventions: 1 to 1 sessions, Unit Group sessions and Medication administration.  Evaluation of Outcomes: Progressing   RN Treatment Plan for Primary Diagnosis: Schizophreniform disorder (HCC) Long Term Goal(s): Knowledge of disease and therapeutic regimen to maintain health will improve  Short Term Goals: Ability to remain free from injury will improve, Ability to identify and develop effective coping behaviors will improve and Compliance with prescribed medications will improve  Medication Management: RN will administer medications as ordered by provider, will assess and evaluate patient's response and provide education to patient for prescribed medication. RN will report any adverse and/or side effects to prescribing provider.  Therapeutic Interventions: 1 on 1 counseling sessions, Psychoeducation, Medication administration, Evaluate responses to treatment, Monitor vital signs and CBGs as ordered, Perform/monitor CIWA, COWS, AIMS and Fall Risk screenings as ordered, Perform wound care treatments as ordered.  Evaluation of Outcomes: Progressing   LCSW Treatment Plan for Primary Diagnosis: Schizophreniform disorder (HCC) Long  Term Goal(s): Safe transition to appropriate next level of care at discharge, Engage patient  in therapeutic group addressing interpersonal concerns.  Short Term Goals: Engage patient in aftercare planning with referrals and resources, Increase social support, Identify triggers associated with mental health/substance abuse issues and Increase skills for wellness and recovery  Therapeutic Interventions: Assess for all discharge needs, 1 to 1 time with Social worker, Explore available resources and support systems, Assess for adequacy in community support network, Educate family and significant other(s) on suicide prevention, Complete Psychosocial Assessment, Interpersonal group therapy.  Evaluation of Outcomes: Progressing   Progress in Treatment: Attending groups: No. Participating in groups: No. Taking medication as prescribed: Yes. Toleration medication: Yes. Family/Significant other contact made: No, will contact:  Patients mother Patient understands diagnosis: Yes. Discussing patient identified problems/goals with staff: Yes. Medical problems stabilized or resolved: Yes. Denies suicidal/homicidal ideation: Yes. Issues/concerns per patient self-inventory: No. Other:   New problem(s) identified: No, Describe:  None  New Short Term/Long Term Goal(s): "To better myself to make sure I stay healthy and live."  Patient Goals:   "To better myself to make sure I stay healthy and live."  Discharge Plan or Barriers: To discharge back to the shelter and follow up with outpatient treatment.  Reason for Continuation of Hospitalization: Medication stabilization  Estimated Length of Stay: 5-7 days  Recreational Therapy: Patient Stressors: N/A Patient Goal: Patient will engage in groups without prompting or encouragement from LRT x3 group sessions within 5 recreation therapy group sessions  Attendees: Patient: Larry Walton 08/06/2018 11:27 AM  Physician: Corinna Gab, MD 08/06/2018 11:27 AM  Nursing: Bruce Donath, RN 08/06/2018 11:27 AM  RN Care Manager: 08/06/2018 11:27 AM  Social  Worker: Johny Shears, LCSWA 08/06/2018 11:27 AM  Recreational Therapist: Danella Deis. Dreama Saa, LRT 08/06/2018 11:27 AM  Other: Huey Romans,  LCSW 08/06/2018 11:27 AM  Other: Damian Leavell, Cha[place]in 08/06/2018 11:27 AM  Other:Harvey Beverely Pace, RHA 08/06/2018 11:27 AM    Scribe for Treatment Team: Johny Shears, LCSW 08/06/2018 11:27 AM

## 2018-08-06 NOTE — H&P (Addendum)
Psychiatric Admission Assessment Adult  Patient Identification: Larry Walton MRN:  [id number] Date of Evaluation:  08/06/2018 Chief Complaint:  Psychosis Principal Diagnosis: Severe manic bipolar 1 disorder with psychotic behavior ([place]) Diagnosis:   Patient Active Problem List   Diagnosis Date Noted  . Severe manic bipolar 1 disorder with psychotic behavior ([place]) [F31.2] 08/06/2018    Priority: High   History of Present Illness: 22 yo male admitted due to bizzare behaviors. He was brought in by police on 6/71. He was very disorganized in the ED. He was talking about "seeing into another space."  He was very restless and bizzare in ED and required IM medications. He was very incoherent with assessments and extremely disorganized in thoughts. He was talking about seeing a large spider on his shoulder and on his face. He reported the spider was "the size of an infant."   Upon evaluation today, he is initially organized and able to give history. However, as interview went on his thoughts became more bizzare and disorganized. He states that he has been staying at a shelter for 2 weeks. Prior to this, he was staying with his mother. He does not answer why he no longer lives with his mom. When asked why he came to the hospital, he states, "I freaked out a bit." He then states that he thought there was a big spider on his shoulder. He states that he now knows that there wasn't really spider on him. He states that he was at a park and saw a spider on the ground and then he was scared that it was crawling on him. He states "I threw off my shirt and shorts and started running." He then states that he went into the [facility] and "I was going to play ball with my friend and then the officers picked me up.' When asked why the officers picked him up, he states, "to see if they can follow up." Pts thoughts are very fragmented. When asked if he ever hears voices, he states, "possibly." He states "they are in different  pitches..in very different pitches." He states, "they can fade in and sneak up. They are good because they are harmonizing." He states that sometimes "they say you[re stupid." HE states that he knows these are not real. He denies any command hallucinations. HE also endorses VH of "formations. I guess they are there because I am older. They are [employer] games. Like [employer] games and [employer]." He reports his sleep is erratic. HE states that he has gone 2-3 days in the past without sleep at all. He states that during this time "I would go to work and then play games and not sleep." He states that he had a lot of energy during this time.  He states that he would then "get real sluggish. It made me see differently. Like I"m ahead of something." He also endorses vague paranoia "like someone is watching me. Specific people to make sure i'm okay."  Pt states that his goal is to make music videos and to become a Geophysicist/field seismologist. He states that he is writing down all of his ideas. He asked [place] in treatment team "how to get equipment on the unit or where the best place to shoot a video is." Pt adamantly denies SI or HI. He reports smoking 1-2 grams of marijuana a day. He then states that he does not smoke everyday. When asked about other drugs, He states, "I used to smoke [place] but not anymore."  Denies other drug use. He did take Risperdal last night and states, "It helped me sleep." Pt laughs and smiles inappropriately at times during interview. He is grandiose and euphoric.   Associated Signs/Symptoms: Depression Symptoms:  Denies (Hypo) Manic Symptoms:  Delusions, Distractibility, Elevated Mood, Flight of Ideas, Grandiosity, Hallucinations, Labiality of Mood, Anxiety Symptoms:  Denies Psychotic Symptoms:  Delusions, Hallucinations: Auditory Visual Paranoia, PTSD Symptoms: Negative Total Time spent with patient: 1 hour  Past Psychiatric History: Denies past psychiatric history. HE does not  have any outpatient providers. Denies past inpatient hospitalizations. Denies past suicide attempts. No past medications.   Is the patient at risk to self? No.  Has the patient been a risk to self in the past 6 months? No.  Has the patient been a risk to self within the distant past? No.  Is the patient a risk to others? No.  Has the patient been a risk to others in the past 6 months? No.  Has the patient been a risk to others within the distant past? No.  Alcohol Screening: 1. How often do you have a drink containing alcohol?: Never 2. How many drinks containing alcohol do you have on a typical day when you are drinking?: 1 or 2 3. How often do you have six or more drinks on one occasion?: Never AUDIT-C Score: 0 4. How often during the last year have you found that you were not able to stop drinking once you had started?: Never 5. How often during the last year have you failed to do what was normally expected from you becasue of drinking?: Never 6. How often during the last year have you needed a first drink in the morning to get yourself going after a heavy drinking session?: Never 7. How often during the last year have you had a feeling of guilt of remorse after drinking?: Never 8. How often during the last year have you been unable to remember what happened the night before because you had been drinking?: Never 9. Have you or someone else been injured as a result of your drinking?: No 10. Has a relative or friend or a doctor or another health worker been concerned about your drinking or suggested you cut down?: No Alcohol Use Disorder Identification Test Final Score (AUDIT): 0 Intervention/Follow-up: Continued Monitoring, AUDIT Score <7 follow-up not indicated Substance Abuse History in the last 12 months:  Yes.  , marijuana use Consequences of Substance Abuse: Medical Consequences:  psychosis Previous Psychotropic Medications: No  Psychological Evaluations: No  Past Medical History:  History reviewed. No pertinent past medical history.  Past Surgical History:  Procedure Laterality Date  . ARTHROSCOPIC REPAIR ACL     left   Family History: History reviewed. No pertinent family history. Family Psychiatric  History: Pt denies any family psych history Tobacco Screening: Have you used any form of tobacco in the last 30 days? (Cigarettes, Smokeless Tobacco, Cigars, and/or Pipes): No Social History: From [place]. He has been staying at [facility] for 2 weeks. Previously he was staying with his mother. Unclear where his parents are living. He states that they have not talked in awhile. He graduated high school. He did some classes at [employer] and plans to go back in the Spring. He denies having any real close support system. He states that he was working at [employer] and should be getting another paycheck. If he does, he looked at an apartment in [place] to rent.   Additional Social History: Marital status: Single Are  you sexually active?: No What is your sexual orientation?: Heterosexual Has your sexual activity been affected by drugs, alcohol, medication, or emotional stress?: No Does patient have children?: No                         Allergies:  No Known Allergies Lab Results:  Results for orders placed or performed during the hospital encounter of 08/04/18 (from the past 48 hour(s))  Urinalysis, Complete w Microscopic     Status: Abnormal   Collection Time: 08/04/18  2:37 PM  Result Value Ref Range   Color, Urine YELLOW (A) YELLOW   APPearance CLEAR (A) CLEAR   Specific Gravity, Urine 1.021 1.005 - 1.030   pH 6.0 5.0 - 8.0   Glucose, UA NEGATIVE NEGATIVE mg/dL   Hgb urine dipstick NEGATIVE NEGATIVE   Bilirubin Urine NEGATIVE NEGATIVE   Ketones, ur NEGATIVE NEGATIVE mg/dL   Protein, ur NEGATIVE NEGATIVE mg/dL   Nitrite NEGATIVE NEGATIVE   Leukocytes, UA NEGATIVE NEGATIVE   RBC / HPF 0-5 0 - 5 RBC/hpf   WBC, UA 0-5 0 - 5 WBC/hpf   Bacteria, UA NONE  SEEN NONE SEEN   Squamous Epithelial / LPF 0-5 0 - 5   Mucus PRESENT    Hyaline Casts, UA PRESENT     Comment: Performed at [facility], [address], [place], [place] [address]  Urine Drug Screen, Qualitative     Status: Abnormal   Collection Time: 08/04/18  2:37 PM  Result Value Ref Range   Tricyclic, Ur Screen NONE DETECTED NONE DETECTED   Amphetamines, Ur Screen NONE DETECTED NONE DETECTED   MDMA (Ecstasy)Ur Screen NONE DETECTED NONE DETECTED   Cocaine Metabolite,Ur [place] NONE DETECTED NONE DETECTED   Opiate, Ur Screen NONE DETECTED NONE DETECTED   Phencyclidine (PCP) Ur S NONE DETECTED NONE DETECTED   Cannabinoid 50 Ng, Ur [place] POSITIVE (A) NONE DETECTED   Barbiturates, Ur Screen NONE DETECTED NONE DETECTED   Benzodiazepine, Ur Scrn NONE DETECTED NONE DETECTED   Methadone Scn, Ur NONE DETECTED NONE DETECTED    Comment: (NOTE) Tricyclics + metabolites, urine    Cutoff 1000 ng/mL Amphetamines + metabolites, urine  Cutoff 1000 ng/mL MDMA (Ecstasy), urine              Cutoff 500 ng/mL Cocaine Metabolite, urine          Cutoff 300 ng/mL Opiate + metabolites, urine        Cutoff 300 ng/mL Phencyclidine (PCP), urine         Cutoff 25 ng/mL Cannabinoid, urine                 Cutoff 50 ng/mL Barbiturates + metabolites, urine  Cutoff 200 ng/mL Benzodiazepine, urine              Cutoff 200 ng/mL Methadone, urine                   Cutoff 300 ng/mL The urine drug screen provides only a preliminary, unconfirmed analytical test result and should not be used for non-medical purposes. Clinical consideration and professional judgment should be applied to any positive drug screen result due to possible interfering substances. A more specific alternate chemical method must be used in order to obtain a confirmed analytical result. Gas chromatography / mass spectrometry (GC/MS) is the preferred confirmat ory method. Performed at [facility], [address],  [place], [place] [address]   Comprehensive metabolic panel  Status: Abnormal   Collection Time: 08/04/18  2:37 PM  Result Value Ref Range   Sodium 142 135 - 145 mmol/L   Potassium 4.0 3.5 - 5.1 mmol/L   Chloride 107 98 - 111 mmol/L   CO2 26 22 - 32 mmol/L   Glucose, Bld 96 70 - 99 mg/dL   BUN 19 6 - 20 mg/dL   Creatinine, Ser 1.41 (H) 0.61 - 1.24 mg/dL   Calcium 9.0 8.9 - 10.3 mg/dL   Total Protein 6.7 6.5 - 8.1 g/dL   Albumin 4.2 3.5 - 5.0 g/dL   AST 215 (H) 15 - 41 U/L   ALT 113 (H) 0 - 44 U/L   Alkaline Phosphatase 48 38 - 126 U/L   Total Bilirubin 1.0 0.3 - 1.2 mg/dL   GFR calc non Af Amer >60 >60 mL/min   GFR calc Af Amer >60 >60 mL/min    Comment: (NOTE) The eGFR has been calculated using the CKD EPI equation. This calculation has not been validated in all clinical situations. eGFR's persistently <60 mL/min signify possible Chronic Kidney Disease.    Anion gap 9 5 - 15    Comment: Performed at [facility], [place], [place] [address]  Acetaminophen level     Status: Abnormal   Collection Time: 08/04/18  2:37 PM  Result Value Ref Range   Acetaminophen (Tylenol), Serum <10 (L) 10 - 30 ug/mL    Comment: (NOTE) Therapeutic concentrations vary significantly. A range of 10-30 ug/mL  may be an effective concentration for many patients. However, some  are best treated at concentrations outside of this range. Acetaminophen concentrations >150 ug/mL at 4 hours after ingestion  and >50 ug/mL at 12 hours after ingestion are often associated with  toxic reactions. Performed at [facility], [address] [place], [place] [address]   Ethanol     Status: None   Collection Time: 08/04/18  2:37 PM  Result Value Ref Range   Alcohol, Ethyl (B) <10 <10 mg/dL    Comment: (NOTE) Lowest detectable limit for serum alcohol is 10 mg/dL. For medical purposes only. Performed at [facility], [address] [place], [place] [address]    Salicylate level     Status: None   Collection Time: 08/04/18  2:37 PM  Result Value Ref Range   Salicylate Lvl <9.9 2.8 - 30.0 mg/dL    Comment: Performed at [facility], [address] [place], [place] [address]  CBC with Differential     Status: Abnormal   Collection Time: 08/04/18  2:37 PM  Result Value Ref Range   WBC 7.5 3.8 - 10.6 K/uL   RBC 4.53 4.40 - 5.90 MIL/uL   Hemoglobin 12.6 (L) 13.0 - 18.0 g/dL   HCT 38.3 (L) 40.0 - 52.0 %   MCV 84.5 80.0 - 100.0 fL   MCH 27.9 26.0 - 34.0 pg   MCHC 33.0 32.0 - 36.0 g/dL   RDW 13.9 11.5 - 14.5 %   Platelets 171 150 - 440 K/uL   Neutrophils Relative % 78 %   Neutro Abs 5.8 1.4 - 6.5 K/uL   Lymphocytes Relative 14 %   Lymphs Abs 1.0 1.0 - 3.6 K/uL   Monocytes Relative 8 %   Monocytes Absolute 0.6 0.2 - 1.0 K/uL   Eosinophils Relative 0 %   Eosinophils Absolute 0.0 0 - 0.7 K/uL   Basophils Relative 0 %   Basophils Absolute 0.0 0 - 0.1 K/uL  Comment: Performed at [facility], [address] [place], [place] [address]    Blood Alcohol level:  Lab Results  Component Value Date   [facility] <10 05/04/1600    Metabolic Disorder Labs:  No results found for: HGBA1C, MPG No results found for: PROLACTIN No results found for: CHOL, TRIG, HDL, CHOLHDL, VLDL, LDLCALC  Current Medications: Current Facility-Administered Medications  Medication Dose Route Frequency Provider Last Rate Last Dose  . acetaminophen (TYLENOL) tablet 650 mg  650 mg Oral Q6H PRN Clapacs, John T, MD      . alum & mag hydroxide-simeth (MAALOX/MYLANTA) 200-200-20 MG/5ML suspension 30 mL  30 mL Oral Q4H PRN Clapacs, John T, MD      . hydrOXYzine (ATARAX/VISTARIL) tablet 50 mg  50 mg Oral TID PRN Clapacs, John T, MD      . magnesium hydroxide (MILK OF MAGNESIA) suspension 30 mL  30 mL Oral Daily PRN Clapacs, John T, MD      . risperiDONE (RISPERDAL) tablet 1 mg  1 mg Oral BID Jhace Fennell R, MD      . traZODone (DESYREL) tablet 100 mg  100 mg  Oral QHS PRN Clapacs, Madie Reno, MD   100 mg at 08/05/18 2107   PTA Medications: Medications Prior to Admission  Medication Sig Dispense Refill Last Dose  . brompheniramine-pseudoephedrine-DM 30-2-10 MG/5ML syrup Take 10 mLs by mouth 4 (four) times daily as needed. (Patient not taking: Reported on 08/04/2018) 120 mL 0 Completed Course at Unknown time  . ibuprofen (ADVIL,MOTRIN) 400 MG tablet Take 1 tablet (400 mg total) by mouth every 6 (six) hours as needed. (Patient not taking: Reported on 08/04/2018) 30 tablet 0 Completed Course at Unknown time  . predniSONE (DELTASONE) 50 MG tablet Take 1 tablet (50 mg total) by mouth daily. (Patient not taking: Reported on 08/04/2018) 5 tablet 0 Completed Course at Unknown time    Musculoskeletal: Strength & Muscle Tone: within normal limits Gait & Station: normal Patient leans: N/A  Psychiatric Specialty Exam: Physical Exam  Nursing note and vitals reviewed.   Review of Systems  All other systems reviewed and are negative.   Blood pressure (!) 106/57, pulse (!) 104, temperature 98.3 F (36.8 C), temperature source Oral, resp. rate 18, height '5\' 10"'$  (1.778 m), weight 70.8 kg, SpO2 97 %.Body mass index is 22.38 kg/m.  General Appearance: Casual  Eye Contact:  Good  Speech:  Clear and Coherent  Volume:  Normal  Mood:  Euphoric  Affect:  Congruent  Thought Process:  Disorganized and Irrelevant, fragmented, often changes thoughts  Orientation:  Full (Time, Place, and Person)  Thought Content:  Illogical, Delusions, Hallucinations: Auditory and Paranoid Ideation  Suicidal Thoughts:  No  Homicidal Thoughts:  No  Memory:  Immediate;   Fair  Judgement:  Impaired  Insight:  Lacking  Psychomotor Activity:  Normal  Concentration:  Concentration: Poor  Recall:  [employer] of Knowledge:  Fair  Language:  Fair  Akathisia:  No      Assets:  Resilience  ADL's:  Intact  Cognition:  WNL  Sleep:  Number of Hours: 8    Treatment Plan Summary: 22 yo  male admitted due to bizzare behaviors. He was very disorganized and bizzare in the ED. He is slightly more coherent and logical today but easily becomes disorganized when answering questions. He endorses history of decreased need for sleep, AH and VH and paranoia. He is a bit euphoric and grandiose today. Differential including Bipolar I disorder  Vs. Schizophreniform vs Cannabis induced psychosis. He does have history suggestive of mania.   Plan:  Mood/Psychosis -Increase Risperdal to 1 mg BID -prn trazodone for insomnia -QTc 370  Cannabis use disorder -strongly advised to abstain from marijuana use due to risks of causing/worsening psychosis.   Elevated LFTs -Unclear etiology as he has not been on any medications at all -will consult GI to see if further workup needed  Dispo -will follow up with [facility]. Possibly discharge back to shelter  Observation Level/Precautions:  15 minute checks  Laboratory:  Done in ED  Psychotherapy:    Medications:    Consultations:    Discharge Concerns:    Estimated LOS: 5-7 days  Other:     Physician Treatment Plan for Primary Diagnosis: Severe manic bipolar 1 disorder with psychotic behavior ([place]) Long Term Goal(s): Improvement in symptoms so as ready for discharge  Short Term Goals: Ability to demonstrate self-control will improve   I certify that inpatient services furnished can reasonably be expected to improve the patient's condition.    Marylin Crosby, MD 10/2/201912:21 PM

## 2018-08-06 NOTE — Progress Notes (Signed)
Recreation Therapy Notes  INPATIENT RECREATION THERAPY ASSESSMENT  Patient Details Name: Larry Walton MRN: [id number] DOB: 01-12-1996 Today's Date: 08/06/2018       Information Obtained From: Patient  Able to Participate in Assessment/Interview: Yes  Patient Presentation: Responsive  Reason for Admission (Per Patient): Active Symptoms  Patient Stressors:    Coping Skills:   Write  Leisure Interests (2+):  Music - Listen, Music - Write music  Frequency of Recreation/Participation: Weekly  Awareness of Community Resources:  Yes  Community Resources:  [employer]  Current Use: Yes  If no, Barriers?:    Expressed Interest in [employer] Information:    [employer] of Residence:  Film/video editor  Patient Main Form of Transportation: Therapist, music  Patient Strengths:  Nice  Patient Identified Areas of Improvement:  Talking more  Patient Goal for Hospitalization:  To better myself and stay healthy and alive  Current SI (including self-harm):  No  Current HI:  No  Current AVH: No  Staff Intervention Plan: Group Attendance, Collaborate with Interdisciplinary Treatment Team  Consent to Intern Participation: N/A  Daiel Strohecker 08/06/2018, 2:16 PM

## 2018-08-06 NOTE — Progress Notes (Signed)
D: Pt denies SI/HI/AVH, verbally contracts for safety during assessments. Pt is pleasant and cooperative, but can be minimal, not forwarding much at times. Pt. has no Complaints. Patient Interaction appropriate this evening. Pt. Spent a majority of the shift isolative and withdrawn to his room. Pt. Did attend snacks and come up for night medications. Pt. Does admit that prior to admission, "yeah..I was like seeing this spider..it was super weird.Marland KitchenMarland KitchenI'm not really sure how to explain it". Pt. Given sleep-aid PRN. No other notable observations.   A: Q x 15 minute observation checks were completed for safety. Patient was provided with education, but needs reinforcement.  Patient was given/offered medications per orders. Patient  was encourage to attend groups, participate in unit activities and continue with plan of care. Pt. Chart and plans of care reviewed. Pt. Given support and encouragement.   R: Patient is complaint with medication and unit procedures.             Precautionary checks every 15 minutes for safety maintained, room free of safety hazards, patient sustains no injury or falls during this shift. Will endorse care to next shift.

## 2018-08-06 NOTE — Progress Notes (Signed)
   08/06/18 1100  Clinical Encounter Type  Visited With Patient  Visit Type Initial;Spiritual support;Behavioral Health  Referral From Social work  Consult/Referral To Chaplain  Spiritual Encounters  Spiritual Needs Emotional;Other (Comment)   CH attended the patient's treatment team meeting. Larry Walton stated that his goals were to better himself and stay healthy. The patient was asked by [name] if he had any questions and the patient stated that he wanted help making a video. When the patient was told that he could not record a video at the hospital he was encouraged to continue to write his ideas down and do them when he is released. Larry Walton seemed fixated on making a movie. The MD re-directed the patient but he went back to the video many times.

## 2018-08-06 NOTE — Progress Notes (Signed)
D- Patient alert and oriented. Patient presents in a pleasant mood on assessment stating that he slept good last not without voicing any major complaints or concerns to this Clinical research associate. Patient denies SI, HI, AVH, and pain at this time. Patient also denies any signs/symptoms of depression or anxiety. Patient's goal for today is to "eat and make music".  A- Scheduled medications administered to patient, per MD orders. Support and encouragement provided.  Routine safety checks conducted every 15 minutes.  Patient informed to notify staff with problems or concerns.  R- No adverse drug reactions noted. Patient contracts for safety at this time. Patient compliant with medications and treatment plan. Patient receptive, calm, and cooperative. Patient interacts well with others on the unit.  Patient remains safe at this time.

## 2018-08-07 LAB — HEPATITIS PANEL, ACUTE
Hep A IgM: NEGATIVE
Hep B C IgM: NEGATIVE
Hepatitis B Surface Ag: NEGATIVE

## 2018-08-07 LAB — CERULOPLASMIN: CERULOPLASMIN: 25.8 mg/dL (ref 16.0–31.0)

## 2018-08-07 MED ORDER — RISPERIDONE 1 MG PO TABS
1.0000 mg | ORAL_TABLET | ORAL | Status: DC
  Administered 2018-08-08: 1 mg via ORAL
  Filled 2018-08-07: qty 1

## 2018-08-07 MED ORDER — RISPERIDONE 1 MG PO TABS
2.0000 mg | ORAL_TABLET | Freq: Every day | ORAL | Status: DC
  Administered 2018-08-07: 2 mg via ORAL
  Filled 2018-08-07: qty 2

## 2018-08-07 NOTE — Progress Notes (Signed)
[facility] Gastroenterology Inpatient Progress Note  Subjective: Patient seen for f/u elevated liver enzymes. Patient denies pain, SOB. Bizarre thoughts now resolved after some treatment.  Objective: Vital signs in last 24 hours: Temp:  [98.4 F (36.9 C)] 98.4 F (36.9 C) (10/03 0624) Pulse Rate:  [49-51] 49 (10/03 0625) Resp:  [18] 18 (10/03 0625) BP: (103-113)/(56-66) 113/66 (10/03 0625) SpO2:  [100 %] 100 % (10/03 0625) Blood pressure 113/66, pulse (!) 49, temperature 98.4 F (36.9 C), temperature source Oral, resp. rate 18, height 5\' 10"  (1.778 m), weight 70.8 kg, SpO2 100 %.    Intake/Output from previous day: 10/02 0701 - 10/03 0700 In: 960 [P.O.:960] Out: -   Intake/Output this shift: Total I/O In: 600 [P.O.:600] Out: -  Demetria Ravenell, RN chaperoned examination and entire visit with the patient.  General appearance: Alert, oriented x 3 Resp:CTA Cardio: RR nl S1, S2 GI: soft, BS+ nt, nd. Extremities: No edema. Normal tone.   Lab Results: Results for orders placed or performed during the hospital encounter of 08/05/18 (from the past 24 hour(s))  Comprehensive metabolic panel     Status: Abnormal   Collection Time: 08/06/18  4:51 PM  Result Value Ref Range   Sodium 139 135 - 145 mmol/L   Potassium 4.4 3.5 - 5.1 mmol/L   Chloride 104 98 - 111 mmol/L   CO2 27 22 - 32 mmol/L   Glucose, Bld 117 (H) 70 - 99 mg/dL   BUN 19 6 - 20 mg/dL   Creatinine, Ser 1.61 (H) 0.61 - 1.24 mg/dL   Calcium 9.8 8.9 - 09.6 mg/dL   Total Protein 8.3 (H) 6.5 - 8.1 g/dL   Albumin 5.0 3.5 - 5.0 g/dL   AST 045 (H) 15 - 41 U/L   ALT 109 (H) 0 - 44 U/L   Alkaline Phosphatase 63 38 - 126 U/L   Total Bilirubin 0.6 0.3 - 1.2 mg/dL   GFR calc non Af Amer >60 >60 mL/min   GFR calc Af Amer >60 >60 mL/min   Anion gap 8 5 - 15  Ceruloplasmin     Status: None   Collection Time: 08/06/18  4:51 PM  Result Value Ref Range   Ceruloplasmin 25.8 16.0 - 31.0 mg/dL  Iron and TIBC     Status:  None   Collection Time: 08/06/18  4:51 PM  Result Value Ref Range   Iron 73 45 - 182 ug/dL   TIBC 409 811 - 914 ug/dL   Saturation Ratios 18 17.9 - 39.5 %   UIBC 331 ug/dL  Ferritin     Status: None   Collection Time: 08/06/18  4:51 PM  Result Value Ref Range   Ferritin 69 24 - 336 ng/mL  Hepatitis panel, acute     Status: None   Collection Time: 08/06/18  4:51 PM  Result Value Ref Range   Hepatitis B Surface Ag Negative Negative   HCV Ab <0.1 0.0 - 0.9 s/co ratio   Hep A IgM Negative Negative   Hep B C IgM Negative Negative     Recent Labs    08/04/18 1437  WBC 7.5  HGB 12.6*  HCT 38.3*  PLT 171   BMET Recent Labs    08/04/18 1437 08/06/18 1651  NA 142 139  K 4.0 4.4  CL 107 104  CO2 26 27  GLUCOSE 96 117*  BUN 19 19  CREATININE 1.41* 1.41*  CALCIUM 9.0 9.8   LFT Recent Labs  08/06/18 1651  PROT 8.3*  ALBUMIN 5.0  AST 137*  ALT 109*  ALKPHOS 63  BILITOT 0.6   PT/INR No results for input(s): LABPROT, INR in the last 72 hours. Hepatitis Panel Recent Labs    08/06/18 1651  HEPBSAG Negative  HCVAB <0.1  HEPAIGM Negative  HEPBIGM Negative   C-Diff No results for input(s): CDIFFTOX in the last 72 hours. No results for input(s): CDIFFPCR in the last 72 hours.   Studies/Results: No results found.  Scheduled Inpatient Medications:   . risperiDONE  1 mg Oral BID    Continuous Inpatient Infusions:    PRN Inpatient Medications:  acetaminophen, alum & mag hydroxide-simeth, hydrOXYzine, magnesium hydroxide, traZODone  Miscellaneous: ceruloplasmin normal at 25.8.   Assessment:  1. Elevated liver enzymes - improving. Unknown etiology. Suspect some toxic or infectious exposure  Plan:  1. Will follow rest of results.  2. Will be happy to see as an outpatient if no resolution over the next several weeks. Call back if we can help.   Birch Farino K. Norma Fredrickson, M.D. 08/07/2018, 12:24 PM

## 2018-08-07 NOTE — [facility] Group Notes (Signed)
[facility] Group Notes:  (Nursing/MHT/Case Management/Adjunct)  Date:  08/07/2018  Time:  2:05 AM  Type of Therapy:  Group Therapy  Participation Level:  Active  Participation Quality:  Appropriate  Affect:  Appropriate  Cognitive:  Alert  Insight:  Good  Engagement in Group:  Engaged  Modes of Intervention:  Support  Summary of Progress/Problems:  Larry Walton 08/07/2018, 2:05 AM

## 2018-08-07 NOTE — Plan of Care (Signed)
Patient verbalized understanding of information provided.

## 2018-08-07 NOTE — Progress Notes (Signed)
Patient is alert and oriented x 4, denies SI/HI/AVH but noted responding to internal stimuli. Patient was noted in the room, minimal interaction with peers and staff. Patient's thoughts are organized but tangential at times with some delayed response noted during the shift. Patient was offered emotional  support and encouraged to attend wrap up group/  Patient attends group interacts appropriately,however he refused night medication stated ' l don't want it" 15 minutes safety check maintained will continue to monitor.

## 2018-08-07 NOTE — Progress Notes (Signed)
D- Patient alert and oriented. Patient presents in a pleasant mood on assessment stating that he slept ok last night and had no major complaints or concerns to voice to this Clinical research associate. Patient denies SI, HI, AVH, and pain at this time. Patient also denies depression and anxiety. Patient has no stated goals for today.  A- Support and encouragement provided.  Routine safety checks conducted every 15 minutes.  Patient informed to notify staff with problems or concerns.  R- Patient contracts for safety at this time. Patient receptive, calm, and cooperative. Patient interacts well with others on the unit. Patient remains safe at this time.

## 2018-08-07 NOTE — [facility] Group Notes (Signed)
LCSW Group Therapy Note  08/07/2018 1:00pm  Type of Therapy/Topic:  Group Therapy:  Balance in Life  Participation Level:  Active  Description of Group:    This group will address the concept of balance and how it feels and looks when one is unbalanced. Patients will be encouraged to process areas in their lives that are out of balance and identify reasons for remaining unbalanced. Facilitators will guide patients in utilizing problem-solving interventions to address and correct the stressor making their life unbalanced. Understanding and applying boundaries will be explored and addressed for obtaining and maintaining a balanced life. Patients will be encouraged to explore ways to assertively make their unbalanced needs known to significant others in their lives, using other group members and facilitator for support and feedback.  Therapeutic Goals: 1. Patient will identify two or more emotions or situations they have that consume much of in their lives. 2. Patient will identify signs/triggers that life has become out of balance:  3. Patient will identify two ways to set boundaries in order to achieve balance in their lives:  4. Patient will demonstrate ability to communicate their needs through discussion and/or role plays  Summary of Patient Progress:  Patient answered all questions and appeared to be OK with sharing in group.    Therapeutic Modalities:   Cognitive Behavioral Therapy Solution-Focused Therapy Assertiveness Training  Cheron Schaumann, [place] 08/07/2018 2:55 PM

## 2018-08-07 NOTE — Progress Notes (Signed)
Patient refused his morning medication reporting that he doesn't think he needs the medication. This Clinical research associate will notify MD.

## 2018-08-07 NOTE — Plan of Care (Signed)
Patient verbalizes understanding of the general information that's been provided to him and has not voiced any further questions or concerns at this time. Patient denies SI/HI/AVH as well as any signs/symptoms of depression and anxiety. Patient has been observed out in the milieu as well as attending and participating in unit groups without any issues. Patient has the ability to redirect his hostility/anger into appropriate behaviors. Patient has remained free from injury thus far and is safe on the unit at this time.  Problem: Education: Goal: Knowledge of [facility] Education information/materials will improve Outcome: Progressing Goal: Emotional status will improve Outcome: Progressing Goal: Mental status will improve Outcome: Progressing Goal: Verbalization of understanding the information provided will improve Outcome: Progressing   Problem: Coping: Goal: Coping ability will improve Outcome: Progressing Goal: Will verbalize feelings Outcome: Progressing   Problem: Safety: Goal: Ability to redirect hostility and anger into socially appropriate behaviors will improve Outcome: Progressing Goal: Ability to remain free from injury will improve Outcome: Progressing   Problem: Safety: Goal: Ability to remain free from injury will improve Outcome: Progressing

## 2018-08-07 NOTE — Progress Notes (Signed)
[facility] MD Progress Note  08/07/2018 2:08 PM Larry Walton  MRN:  [id number] Subjective:  Pt refused his medications last night and this morning because he felt he does not need them. He was noted to be responding to internal stimuli by staff last night. He is tangential and delayed in responses. Upon evaluation today, he states that he has been writing songs. He states, "I've gotten help and is shows." He is unable to elaborate on this. He states that his mom visited last night went well. He is still bizarre in his statements at time and his responses often don't make sense to the question asked. He has delay in responses and appears he is responding to internal stimuli. He starts laughing inappropriately during interview and looking around the room. He reports AH "occasionally." He states, "Parts and bits. " I asked for an example of what they say." He paused for a period of time and states, 'Just parts and bits." He does spontaneously answer that they do not tell him to harm himself or other people. He denies having VH. Regarding VH, He states, "I'm not completely freaked out. It was just my phone. Like some depth. There is always a bigger picture. Like a third party....but a good third party" His thoughts are often fragmented. He reports sleeping well last night. We discussed benefits of medications and how his thoughts are clearer since starting it. He then agreed to take his morning dose of Risperdal. He denies SI or HI.   Principal Problem: Severe manic bipolar 1 disorder with psychotic behavior ([place]) Diagnosis:   Patient Active Problem List   Diagnosis Date Noted  . Severe manic bipolar 1 disorder with psychotic behavior ([place]) [F31.2] 08/06/2018    Priority: High   Total Time spent with patient: 20 minutes  Past Psychiatric History: See H&P  Past Medical History: History reviewed. No pertinent past medical history.  Past Surgical History:  Procedure Laterality Date  . ARTHROSCOPIC REPAIR ACL     left   Family History: History reviewed. No pertinent family history. Family Psychiatric  History: See H&P Social History:  Social History   Substance and Sexual Activity  Alcohol Use No     Social History   Substance and Sexual Activity  Drug Use No    Social History   Socioeconomic History  . Marital status: Single    Spouse name: Not on file  . Number of children: Not on file  . Years of education: Not on file  . Highest education level: Not on file  Occupational History  . Not on file  Social Needs  . Financial resource strain: Not on file  . Food insecurity:    Worry: Not on file    Inability: Not on file  . Transportation needs:    Medical: Not on file    Non-medical: Not on file  Tobacco Use  . Smoking status: Never Smoker  . Smokeless tobacco: Never Used  Substance and Sexual Activity  . Alcohol use: No  . Drug use: No  . Sexual activity: Not on file  Lifestyle  . Physical activity:    Days per week: Not on file    Minutes per session: Not on file  . Stress: Not on file  Relationships  . Social connections:    Talks on phone: Not on file    Gets together: Not on file    Attends religious service: Not on file    Active member of club or organization: Not  on file    Attends meetings of clubs or organizations: Not on file    Relationship status: Not on file  Other Topics Concern  . Not on file  Social History Narrative  . Not on file   Additional Social History:                         Sleep: Fair  Appetite:  Good  Current Medications: Current Facility-Administered Medications  Medication Dose Route Frequency Provider Last Rate Last Dose  . acetaminophen (TYLENOL) tablet 650 mg  650 mg Oral Q6H PRN Clapacs, John T, MD      . alum & mag hydroxide-simeth (MAALOX/MYLANTA) 200-200-20 MG/5ML suspension 30 mL  30 mL Oral Q4H PRN Clapacs, John T, MD      . hydrOXYzine (ATARAX/VISTARIL) tablet 50 mg  50 mg Oral TID PRN Clapacs, John T, MD       . magnesium hydroxide (MILK OF MAGNESIA) suspension 30 mL  30 mL Oral Daily PRN Clapacs, John T, MD      . risperiDONE (RISPERDAL) tablet 1 mg  1 mg Oral BID Savva Beamer, Tyson Babinski, MD   1 mg at 08/07/18 1035  . traZODone (DESYREL) tablet 100 mg  100 mg Oral QHS PRN Clapacs, Madie Reno, MD   100 mg at 08/05/18 2107    Lab Results:  Results for orders placed or performed during the hospital encounter of 08/05/18 (from the past 48 hour(s))  Comprehensive metabolic panel     Status: Abnormal   Collection Time: 08/06/18  4:51 PM  Result Value Ref Range   Sodium 139 135 - 145 mmol/L   Potassium 4.4 3.5 - 5.1 mmol/L   Chloride 104 98 - 111 mmol/L   CO2 27 22 - 32 mmol/L   Glucose, Bld 117 (H) 70 - 99 mg/dL   BUN 19 6 - 20 mg/dL   Creatinine, Ser 1.41 (H) 0.61 - 1.24 mg/dL   Calcium 9.8 8.9 - 10.3 mg/dL   Total Protein 8.3 (H) 6.5 - 8.1 g/dL   Albumin 5.0 3.5 - 5.0 g/dL   AST 137 (H) 15 - 41 U/L   ALT 109 (H) 0 - 44 U/L   Alkaline Phosphatase 63 38 - 126 U/L   Total Bilirubin 0.6 0.3 - 1.2 mg/dL   GFR calc non Af Amer >60 >60 mL/min   GFR calc Af Amer >60 >60 mL/min    Comment: (NOTE) The eGFR has been calculated using the CKD EPI equation. This calculation has not been validated in all clinical situations. eGFR's persistently <60 mL/min signify possible Chronic Kidney Disease.    Anion gap 8 5 - 15    Comment: Performed at [facility], [address] [place], [place] [address]  Ceruloplasmin     Status: None   Collection Time: 08/06/18  4:51 PM  Result Value Ref Range   Ceruloplasmin 25.8 16.0 - 31.0 mg/dL    Comment: (NOTE) Performed At: [facility] [place], [place] [id number] Rush Farmer MD [id number]   Iron and TIBC     Status: None   Collection Time: 08/06/18  4:51 PM  Result Value Ref Range   Iron 73 45 - 182 ug/dL   TIBC 404 250 - 450 ug/dL   Saturation Ratios 18 17.9 - 39.5 %   UIBC 331 ug/dL    Comment: Performed at [facility] [place] [facility], [address] [place], [place] [address]  Ferritin  Status: None   Collection Time: 08/06/18  4:51 PM  Result Value Ref Range   Ferritin 69 24 - 336 ng/mL    Comment: Performed at [facility], [address] [place], [place] [address]  Hepatitis panel, acute     Status: None   Collection Time: 08/06/18  4:51 PM  Result Value Ref Range   Hepatitis B Surface Ag Negative Negative   HCV Ab <0.1 0.0 - 0.9 s/co ratio    Comment: (NOTE)                                  Negative:     < 0.8                             Indeterminate: 0.8 - 0.9                                  Positive:     > 0.9 The CDC recommends that a positive HCV antibody result be followed up with a HCV Nucleic Acid Amplification test (604540). Performed At: [facility] [place], [place] [id number] Rush Farmer MD [id number]    Hep A IgM Negative Negative   Hep B C IgM Negative Negative    Blood Alcohol level:  Lab Results  Component Value Date   ETH <10 84/69/6295    Metabolic Disorder Labs: No results found for: HGBA1C, MPG No results found for: PROLACTIN No results found for: CHOL, TRIG, HDL, CHOLHDL, VLDL, LDLCALC  Physical Findings: AIMS:  , ,  ,  ,    CIWA:    COWS:     Musculoskeletal: Strength & Muscle Tone: within normal limits Gait & Station: normal Patient leans: N/A  Psychiatric Specialty Exam: Physical Exam  Nursing note and vitals reviewed.   Review of Systems  All other systems reviewed and are negative.   Blood pressure 113/66, pulse (!) 49, temperature 98.4 F (36.9 C), temperature source Oral, resp. rate 18, height '5\' 10"'$  (1.778 m), weight 70.8 kg, SpO2 100 %.Body mass index is 22.38 kg/m.  General Appearance: Casual  Eye Contact:  Fair  Speech:  Fragmented  Volume:  Normal  Mood:  Euphoric  Affect:  Labile  Thought Process:  Disorganized  Orientation:  Full (Time, Place, and Person)  Thought Content:  Illogical,  Hallucinations: Auditory and Paranoid Ideation  Suicidal Thoughts:  No  Homicidal Thoughts:  No  Memory:  Immediate;   Fair  Judgement:  Fair  Insight:  Lacking  Psychomotor Activity:  Normal  Concentration:  Concentration: Poor  Recall:  [employer] of Knowledge:  Fair  Language:  Fair  Akathisia:  No      Assets:  Resilience  ADL's:  Intact  Cognition:  WNL  Sleep:  Number of Hours: 6.15     Treatment Plan Summary: 22 yo male admitted due to bizzare behaviors in the community. He has been calm on the unit. He is still disorganized and nonsensical at times in his responses. CSW spoke with his mother who reported some of these symptoms started in February this year. She noted he was talking to movie screens and told her he hears people talking to him when they are not. Likely first break psychosis vs. Mania.  Plan:  Mood/Psychosis -Increase  Risperdal to 1 mg qam and 2 mg qhs -QTc 370  Cannabis Use disorder -strongly advised to abstain from marijuana use due to risks of causing worsening psychosis  Elevated LFTs -GI evaluated patient -lab workup negative. LFTs improving -Appreciate their recs  Dispo -will follow up with [facility]  Marylin Crosby, MD 08/07/2018, 2:08 PM

## 2018-08-07 NOTE — Progress Notes (Signed)
Recreation Therapy Notes  Date: 08/07/2018  Time: 9:30 am  Location: Craft Room  Behavioral response: Appropriate   Intervention Topic: Problem Solving  Discussion/Intervention:  Group content on today was focused on problem solving. The group described what problem solving is. Patients expressed how problems affect them and how they deal with problems. Individuals identified healthy ways to deal with problems. Patients explained what normally happens to them when they do not deal with problems. The group expressed reoccurring problems for them. The group participated in the intervention "Ways to Solve problems" where patients were given a chance to explore different ways to solve problems.  Clinical Observations/Feedback:  Patient came to group and explained that problem solving is trying to find the right answer. He stated he normally uses the process of elimination when trying to solve a problem. Individual was social with peers and staff while participating in the intervention.  Marelin Tat LRT/CTRS         Darcella Shiffman 08/07/2018 11:18 AM

## 2018-08-08 MED ORDER — PALIPERIDONE PALMITATE ER 234 MG/1.5ML IM SUSY
234.0000 mg | PREFILLED_SYRINGE | Freq: Once | INTRAMUSCULAR | Status: AC
  Administered 2018-08-10: 234 mg via INTRAMUSCULAR
  Filled 2018-08-08 (×2): qty 1.5

## 2018-08-08 MED ORDER — RISPERIDONE 1 MG PO TABS
2.0000 mg | ORAL_TABLET | Freq: Once | ORAL | Status: AC
  Administered 2018-08-08: 2 mg via ORAL
  Filled 2018-08-08: qty 2

## 2018-08-08 MED ORDER — PALIPERIDONE ER 3 MG PO TB24
6.0000 mg | ORAL_TABLET | Freq: Every day | ORAL | Status: DC
  Administered 2018-08-09 – 2018-08-14 (×6): 6 mg via ORAL
  Filled 2018-08-08 (×6): qty 2

## 2018-08-08 NOTE — Progress Notes (Signed)
D: Pt denies SI/HI/AVH, verbally is able to contract for safety. Pt is pleasant and cooperative, engages appropriatly. Pt. has no Complaints.  Patient Interactions appropriate. Pt. Visible out in the milieu, watching tv, socializing with peers appropriately. Pt. Overall forwards little, but much more then previous nights. No observed behavioral issues.     A: Q x 15 minute observation checks were completed for safety. Patient was provided with education.  Patient was given/offered medications per orders. Patient  was encourage to attend groups, participate in unit activities and continue with plan of care. Pt. Chart and plans of care reviewed. Pt. Given support and encouragement.   R: Patient is complaint with medication and unit procedures.             Precautionary checks every 15 minutes for safety maintained, room free of safety hazards, patient sustains no injury or falls during this shift. Will endorse care to next shift.

## 2018-08-08 NOTE — [facility] Group Notes (Signed)
[facility] Group Notes:  (Nursing/MHT/Case Management/Adjunct)  Date:  08/08/2018  Time:  9:32 AM  Type of Therapy:  Psychoeducational Skills  Participation Level:  Did Not Attend   Torsha Lemus R Jayanna Kroeger 08/08/2018, 9:32 AM 

## 2018-08-08 NOTE — Plan of Care (Signed)
Pt. Verbalizes feeling more this evening. Pt. Actively engages during assessments, forwarding more. Pt. Denies SI/HI, verbally is able to contract for safety. Pt. Denies all psychiatric symptoms this evening.    Problem: Education: Goal: Emotional status will improve Outcome: Progressing Goal: Mental status will improve Outcome: Progressing   Problem: Coping: Goal: Will verbalize feelings Outcome: Progressing   Problem: Safety: Goal: Ability to remain free from injury will improve Outcome: Progressing   Problem: Safety: Goal: Ability to remain free from injury will improve Outcome: Progressing

## 2018-08-08 NOTE — Progress Notes (Signed)
Recreation Therapy Notes  Date: 08/08/2018  Time: 9:30 am  Location: Craft Room  Behavioral response: Appropriate   Intervention Topic: Creative Expressions  Discussion/Intervention:  Group content on today was focused on creative expressions. The group defined creative expressions and ways they use creative expressions. Individual identified other positive ways creative expressions can be used and why it is important to express yourself. Patients participated in the intervention "expressive painting", where they had a chance to creatively express themselves. Clinical Observations/Feedback:  Patient came to group and stated creative expressions is showing how you feel.Individual identified exercise, reading and writing as ways he express himself.  Patient was social with peers and staff while participating in the intervention. Renisha Cockrum LRT/CTRS         Lorrena Goranson 08/08/2018 12:58 PM

## 2018-08-08 NOTE — [facility] Group Notes (Signed)
LCSW Group Therapy Note  08/08/2018 1:15pm  Type of Therapy and Topic:  Group Therapy:  Feelings around Relapse and Recovery  Participation Level:  Active   Description of Group:    Patients in this group will discuss emotions they experience before and after a relapse. They will process how experiencing these feelings, or avoidance of experiencing them, relates to having a relapse. Facilitator will guide patients to explore emotions they have related to recovery. Patients will be encouraged to process which emotions are more powerful. They will be guided to discuss the emotional reaction significant others in their lives may have to their relapse or recovery. Patients will be assisted in exploring ways to respond to the emotions of others without this contributing to a relapse.  Therapeutic Goals: 1. Patient will identify two or more emotions that lead to a relapse for them 2. Patient will identify two emotions that result when they relapse 3. Patient will identify two emotions related to recovery 4. Patient will demonstrate ability to communicate their needs through discussion and/or role plays   Summary of Patient Progress: This patient seemed to be able to follow the group content today but was very excited to share his RAP words and explained that he took key phrases and ideas grew from that. He was able to support his peers and follow simple questions ( still struggled with expressing thoughts into words ) delayed responses but not as bad as yesterday.    Therapeutic Modalities:   Cognitive Behavioral Therapy Solution-Focused Therapy Assertiveness Training Relapse Prevention Therapy   Ramond Dial 08/08/2018 2:33 PM
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
## 2018-08-08 NOTE — Plan of Care (Signed)
Patient is pleasant and appropriate in the unit.Denies SI,HI and AVH.Noted delay in response.Appetite and energy level good.Attended groups.Personal hygiene maintained.Support and encouragement given.

## 2018-08-09 NOTE — BHH Group Notes (Signed)
LCSW Group Therapy Note   08/09/2018 1:15pm   Type of Therapy and Topic:  Group Therapy:  Trust and Honesty  Participation Level:  Active  Description of Group:    In this group patients will be asked to explore the value of being honest.  Patients will be guided to discuss their thoughts, feelings, and behaviors related to honesty and trusting in others. Patients will process together how trust and honesty relate to forming relationships with peers, family members, and self. Each patient will be challenged to identify and express feelings of being vulnerable. Patients will discuss reasons why people are dishonest and identify alternative outcomes if one was truthful (to self or others). This group will be process-oriented, with patients participating in exploration of their own experiences, giving and receiving support, and processing challenge from other group members.   Therapeutic Goals: 1. Patient will identify why honesty is important to relationships and how honesty overall affects relationships.  2. Patient will identify a situation where they lied or were lied too and the  feelings, thought process, and behaviors surrounding the situation 3. Patient will identify the meaning of being vulnerable, how that feels, and how that correlates to being honest with self and others. 4. Patient will identify situations where they could have told the truth, but instead lied and explain reasons of dishonesty.   Summary of Patient Progress:The patient scored his mood at an 8 (10.) The patient was able to explore the value of being honest.  Patient discussed thoughts, feelings, and behaviors related to honesty and trusting in others. The patient processed together with other group members how trust and honesty relate to forming relationships with peers, family members, and self. Pt actively and appropriately engaged in the group. Patient was able to provide support and validation to other group members.  Patient practiced active listening when interacting with the facilitator and other group members.    Therapeutic Modalities:   Cognitive Behavioral Therapy Solution Focused Therapy Motivational Interviewing Brief Therapy  Kip Cropp  CUEBAS-COLON, LCSW 08/09/2018 11:58 AM

## 2018-08-09 NOTE — Progress Notes (Signed)
Patient alert and oriented x 4,affect is flat,mood is labile, thoughts are organized and coherent. Patient has minimal interaction with peers and staff, receptive to treatment plan, no di stress noted. 15 minutes safety checks maintained , compliant with night time medication will continue to monitor.
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
## 2018-08-09 NOTE — Plan of Care (Signed)
  Problem: Coping: Goal: Coping ability will improve Outcome: Progressing  Patient appears less anxious, interacting appropriately with peers and staff.  

## 2018-08-09 NOTE — Plan of Care (Signed)
D: Patient denies SI/HI/AVH. Patient is calm, cooperative and pleasant. Patient is seen in milieu interacting with peers. Patient has no complaints at this time.  A: Patient was assessed by this nurse. Patient was oriented to unit. Patient's safety was maintained on unit. Q x 15 minute observation checks were completed for safety. Patient care plan was reviewed. Patient was offered support and encouragement. Patient was encourage to attend groups, participate in unit activities and continue with plan of care.    R: Patient has no complaints of pain at this time. Patient is receptive to treatment and safety maintained on unit.     Problem: Education: Goal: Knowledge of [facility] Education information/materials will improve Outcome: Progressing Goal: Mental status will improve Outcome: Progressing   Problem: Coping: Goal: Will verbalize feelings Outcome: Progressing

## 2018-08-10 NOTE — Progress Notes (Signed)
Nursing note 7p-7a  Pt observed interacting with peers on unit this shift. Displayed a bright affect and peasant mood upon interaction with this Clinical research associate. Pt denies pain ,denies SI/HI, and also denies any audio or visual hallucinations at this time. Pt complains of anxiety and insomnia, see MAR for PRN medication administration. Pt is able to verbally contract for safety with this RN. Goal: " Do more crossword puzzles, and communicate more." Pt is now resting in bed with eyes closed, with no signs or symptoms of pain or distress noted. Pt continues to remain safe on the unit and is observed by rounding every 15 min. RN will continue to monitor.

## 2018-08-10 NOTE — BHH Group Notes (Signed)
LCSW Group Therapy Note 08/10/2018 1:15pm  Type of Therapy and Topic: Group Therapy: Feelings Around Returning Home & Establishing a Supportive Framework and Supporting Oneself When Supports Not Available  Participation Level: Active  Description of Group:  Patients first processed thoughts and feelings about upcoming discharge. These included fears of upcoming changes, lack of change, new living environments, judgements and expectations from others and overall stigma of mental health issues. The group then discussed the definition of a supportive framework, what that looks and feels like, and how do to discern it from an unhealthy non-supportive network. The group identified different types of supports as well as what to do when your family/friends are less than helpful or unavailable  Therapeutic Goals  1. Patient will identify one healthy supportive network that they can use at discharge. 2. Patient will identify one factor of a supportive framework and how to tell it from an unhealthy network. 3. Patient able to identify one coping skill to use when they do not have positive supports from others. 4. Patient will demonstrate ability to communicate their needs through discussion and/or role plays.  Summary of Patient Progress:  The patient reports he feels "good." Pt engaged during group session. As patients processed their anxiety about discharge and described healthy supports patient shared he is ready to be discharge. He reports, "I wasn't exercising my thoughts, ignoring everything and not listening. I am more focus on details now." Patient listed his family as his main support system. Patients identified at least one self-care tool they were willing to use after discharge; exercise.   Therapeutic Modalities Cognitive Behavioral Therapy Motivational Interviewing   Larry Walton  CUEBAS-COLON, LCSW 08/10/2018 11:40 AM

## 2018-08-10 NOTE — B[facility] Group Notes (Signed)
[facility] Group Notes:  (Nursing/MHT/Case Management/Adjunct)  Date:  08/10/2018  Time:  9:49 PM  Type of Therapy:  Group Therapy  Participation Level:  Active  Participation Quality:  Appropriate  Affect:  Appropriate  Cognitive:  Appropriate  Insight:  Appropriate  Engagement in Group:  Engaged  Modes of Intervention:  Discussion  Summary of Progress/Problems: Maksym participated in group. Josafat did not have much to say. Zyler made good eye contact and smiled periodically throughout group. MHT went over the rules and expectations of the unit. MHT reviewed visitation and call hours. MHT informed patients not to go in other patient's rooms. MHT informed patients not to share or loan out clothes MHT informed patients not to share personal information or use last names while on the unit. MHT informed patients of routine checks throughout the night. MHT encouraged patients to clean up after themselves MHT processed with patients about effective communication with doctors.  Jinger Neighbors 08/10/2018, 9:49 PM
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
## 2018-08-10 NOTE — Plan of Care (Signed)
D: Patient denies SI/HI/AVH. Patient verbally contracts for safety. Patient is calm, cooperative and pleasant. Patient is seen in milieu interacting with peers. Patient has no complaints at this time.  A: Patient was assessed by this nurse. Patient was oriented to unit. Patient's safety was maintained on unit. Q x 15 minute observation checks were completed for safety. Patient care plan was reviewed. Patient was offered support and encouragement. Patient was encourage to attend groups, participate in unit activities and continue with plan of care.    R: Patient has no complaints of pain at this time. Patient is receptive to treatment and safety maintained on unit.     Problem: Education: Goal: Knowledge of  General Education information/materials will improve Outcome: Progressing Goal: Emotional status will improve Outcome: Progressing Goal: Mental status will improve Outcome: Progressing   

## 2018-08-11 MED ORDER — PALIPERIDONE PALMITATE ER 156 MG/ML IM SUSY
156.0000 mg | PREFILLED_SYRINGE | Freq: Once | INTRAMUSCULAR | Status: AC
  Administered 2018-08-14: 156 mg via INTRAMUSCULAR
  Filled 2018-08-11 (×2): qty 1

## 2018-08-11 NOTE — BHH Counselor (Signed)
CSW spoke with the patients mother Oneida Arenas, [phone]. She repots that she is still not willing to take him back into the home even with the injectable. CSW informed her that the patient does not have appropriate benefits for a group home and that she will need to apply for those benefits once the patient is discharged. She does not want the patient to go to a shelter and mentioned how he has grandparents [place]. She wants the patient to call his grandparents to see if he can go there at discharge. CSW informed her that she can not provide any other housing options for the patient. CSW will e-mail the patients mother a list of group homes for future reference.  Johny Shears, MSW, Theresia Majors, Bridget Hartshorn Clinical Social Worker 08/11/2018 10:04 AM

## 2018-08-11 NOTE — Progress Notes (Signed)
D: Pt denies SI/HI/AVH, verbally able to contract for safety. Pt is pleasant and cooperative. Pt. has no Complaints.  Patient Interaction appropriate and engages for the most part. Pt. Can be a bit guarded. Pt. Denies pain from long acting IM medication received earlier in the day. Pt. Affect appropriate, but a little animated at times. Pt. Frequently visible in the milieu. Pt. Attends groups.   A: Q x 15 minute observation checks were completed for safety. Patient was provided with education.  Patient was given/offered medications per orders. Patient  was encourage to attend groups, participate in unit activities and continue with plan of care. Pt. Chart and plans of care reviewed. Pt. Given support and encouragement.   R: Patient is complaint with medication and unit procedures.             Precautionary checks every 15 minutes for safety maintained, room free of safety hazards, patient sustains no injury or falls during this shift. Will endorse care to next shift.

## 2018-08-11 NOTE — Progress Notes (Signed)
Recreation Therapy Notes  Date: 08/11/2018  Time: 9:30 am  Location: Craft Room  Behavioral response: Appropriate   Intervention Topic: Self-Esteem  Discussion/Intervention:  Group content today was focused on self-esteem. Patient defined self-esteem and where it comes form. The group described reasons self-esteem is important. Individuals stated things that impact self-esteem and positive ways to improve self-esteem. The group participated in the intervention "Collage of Me" where patients were able to create a collage of positive things that makes them who they are. Clinical Observations/Feedback:  Patient came to group and was focused on what peers and staff had to say about self-esteem. Individual participated in the intervention during group. Sabella Traore LRT/CTRS          Deidre Carino 08/11/2018 12:01 PM

## 2018-08-11 NOTE — Tx Team (Signed)
Interdisciplinary Treatment and Diagnostic Plan Update  08/11/2018 Time of Session: 8:30am Larry Walton MRN: [id number]  Principal Diagnosis: Severe manic bipolar 1 disorder with psychotic behavior (HCC)  Secondary Diagnoses: Principal Problem:   Severe manic bipolar 1 disorder with psychotic behavior (HCC)   Current Medications:  Current Facility-Administered Medications  Medication Dose Route Frequency Provider Last Rate Last Dose  . acetaminophen (TYLENOL) tablet 650 mg  650 mg Oral Q6H PRN Clapacs, Jackquline Denmark, MD   650 mg at 08/09/18 2121  . alum & mag hydroxide-simeth (MAALOX/MYLANTA) 200-200-20 MG/5ML suspension 30 mL  30 mL Oral Q4H PRN Clapacs, John T, MD      . hydrOXYzine (ATARAX/VISTARIL) tablet 50 mg  50 mg Oral TID PRN Clapacs, Jackquline Denmark, MD   50 mg at 08/09/18 2121  . magnesium hydroxide (MILK OF MAGNESIA) suspension 30 mL  30 mL Oral Daily PRN Clapacs, John T, MD      . paliperidone (INVEGA) 24 hr tablet 6 mg  6 mg Oral Daily McNew, Ileene Hutchinson, MD   6 mg at 08/11/18 0755  . traZODone (DESYREL) tablet 100 mg  100 mg Oral QHS PRN Clapacs, Jackquline Denmark, MD   100 mg at 08/10/18 2212   PTA Medications: Medications Prior to Admission  Medication Sig Dispense Refill Last Dose  . brompheniramine-pseudoephedrine-DM 30-2-10 MG/5ML syrup Take 10 mLs by mouth 4 (four) times daily as needed. (Patient not taking: Reported on 08/04/2018) 120 mL 0 Completed Course at Unknown time  . ibuprofen (ADVIL,MOTRIN) 400 MG tablet Take 1 tablet (400 mg total) by mouth every 6 (six) hours as needed. (Patient not taking: Reported on 08/04/2018) 30 tablet 0 Completed Course at Unknown time  . predniSONE (DELTASONE) 50 MG tablet Take 1 tablet (50 mg total) by mouth daily. (Patient not taking: Reported on 08/04/2018) 5 tablet 0 Completed Course at Unknown time    Patient Stressors: Financial difficulties Health problems Substance abuse  Patient Strengths: Ability for insight Capable of independent living Communication  skills  Treatment Modalities: Medication Management, Group therapy, Case management,  1 to 1 session with clinician, Psychoeducation, Recreational therapy.   Physician Treatment Plan for Primary Diagnosis: Severe manic bipolar 1 disorder with psychotic behavior (HCC) Long Term Goal(s): Improvement in symptoms so as ready for discharge   Short Term Goals: Ability to demonstrate self-control will improve  Medication Management: Evaluate patient's response, side effects, and tolerance of medication regimen.  Therapeutic Interventions: 1 to 1 sessions, Unit Group sessions and Medication administration.  Evaluation of Outcomes: Progressing  Physician Treatment Plan for Secondary Diagnosis: Principal Problem:   Severe manic bipolar 1 disorder with psychotic behavior (HCC)  Long Term Goal(s): Improvement in symptoms so as ready for discharge   Short Term Goals: Ability to demonstrate self-control will improve     Medication Management: Evaluate patient's response, side effects, and tolerance of medication regimen.  Therapeutic Interventions: 1 to 1 sessions, Unit Group sessions and Medication administration.  Evaluation of Outcomes: Progressing   RN Treatment Plan for Primary Diagnosis: Severe manic bipolar 1 disorder with psychotic behavior (HCC) Long Term Goal(s): Knowledge of disease and therapeutic regimen to maintain health will improve  Short Term Goals: Ability to remain free from injury will improve, Ability to identify and develop effective coping behaviors will improve and Compliance with prescribed medications will improve  Medication Management: RN will administer medications as ordered by provider, will assess and evaluate patient's response and provide education to patient for prescribed medication. RN will report any adverse  and/or side effects to prescribing provider.  Therapeutic Interventions: 1 on 1 counseling sessions, Psychoeducation, Medication administration,  Evaluate responses to treatment, Monitor vital signs and CBGs as ordered, Perform/monitor CIWA, COWS, AIMS and Fall Risk screenings as ordered, Perform wound care treatments as ordered.  Evaluation of Outcomes: Progressing   LCSW Treatment Plan for Primary Diagnosis: Severe manic bipolar 1 disorder with psychotic behavior (HCC) Long Term Goal(s): Safe transition to appropriate next level of care at discharge, Engage patient in therapeutic group addressing interpersonal concerns.  Short Term Goals: Engage patient in aftercare planning with referrals and resources, Increase social support, Identify triggers associated with mental health/substance abuse issues and Increase skills for wellness and recovery  Therapeutic Interventions: Assess for all discharge needs, 1 to 1 time with Social worker, Explore available resources and support systems, Assess for adequacy in community support network, Educate family and significant other(s) on suicide prevention, Complete Psychosocial Assessment, Interpersonal group therapy.  Evaluation of Outcomes: Progressing   Progress in Treatment: Attending groups: No. Participating in groups: No. Taking medication as prescribed: Yes. Toleration medication: Yes. Family/Significant other contact made: No, will contact:  Patients mother Patient understands diagnosis: Yes. Discussing patient identified problems/goals with staff: Yes. Medical problems stabilized or resolved: Yes. Denies suicidal/homicidal ideation: Yes. Issues/concerns per patient self-inventory: No. Other:   New problem(s) identified: No, Describe:  None  New Short Term/Long Term Goal(s): "To better myself to make sure I stay healthy and live."  Patient Goals:   "To better myself to make sure I stay healthy and live."  Discharge Plan or Barriers: To discharge back to the shelter and follow up with outpatient treatment.  Reason for Continuation of Hospitalization: Medication  stabilization  Estimated Length of Stay: 3 days  Recreational Therapy: Patient Stressors: N/A Patient Goal: Patient will engage in groups without prompting or encouragement from LRT x3 group sessions within 5 recreation therapy group sessions  Attendees: Patient:  08/11/2018 11:20 AM  Physician: Corinna Gab, MD 08/11/2018 11:20 AM  Nursing: Bruce Donath, RN 08/11/2018 11:20 AM  RN Care Manager: 08/11/2018 11:20 AM  Social Worker: Johny Shears, LCSWA 08/11/2018 11:20 AM  Recreational Therapist:  08/11/2018 11:20 AM  Other: Jake Shark,  LCSW 08/11/2018 11:20 AM  Other:Dr. Jennet Maduro, MD 08/11/2018 11:20 AM  Other:Harvey Beverely Pace, RHA 08/11/2018 11:20 AM    Scribe for Treatment Team: Johny Shears, LCSW 08/11/2018 11:20 AM

## 2018-08-11 NOTE — Progress Notes (Signed)
[facility] MD Progress Note  08/11/2018 1:32 PM Larry Walton  MRN:  [id number] Subjective:  Pt has been out in the dayroom with peers. He is less euphoric. He is vague with answers but much less bizzare. He states that he is bored here. He denies feeling depressed. He states that he has been writing music and "coming up with new games to play." He reports sleeping fine. When asked what is better since coming to the hospital, he states, "I'm getting better at basketball and my writing skills are better." He denies AH or VH. He has less thought blocking. Denies SI or HI. He states that he has applied for jobs at [employer] and just needs to follow up with them. He got initial INvega injection yesterday and tolerated it well. He is aware the second one will be given on Thursday.   Principal Problem: Severe manic bipolar 1 disorder with psychotic behavior (HCC) Diagnosis:   Patient Active Problem List   Diagnosis Date Noted  . Severe manic bipolar 1 disorder with psychotic behavior (HCC) [F31.2] 08/06/2018    Priority: High   Total Time spent with patient: 20 minutes  Past Psychiatric History: See H&P  Past Medical History: History reviewed. No pertinent past medical history.  Past Surgical History:  Procedure Laterality Date  . ARTHROSCOPIC REPAIR ACL     left   Family History: History reviewed. No pertinent family history. Family Psychiatric  History: See H&P Social History:  Social History   Substance and Sexual Activity  Alcohol Use No     Social History   Substance and Sexual Activity  Drug Use No    Social History   Socioeconomic History  . Marital status: Single    Spouse name: Not on file  . Number of children: Not on file  . Years of education: Not on file  . Highest education level: Not on file  Occupational History  . Not on file  Social Needs  . Financial resource strain: Not on file  . Food insecurity:    Worry: Not on file    Inability: Not on file  .  Transportation needs:    Medical: Not on file    Non-medical: Not on file  Tobacco Use  . Smoking status: Never Smoker  . Smokeless tobacco: Never Used  Substance and Sexual Activity  . Alcohol use: No  . Drug use: No  . Sexual activity: Not on file  Lifestyle  . Physical activity:    Days per week: Not on file    Minutes per session: Not on file  . Stress: Not on file  Relationships  . Social connections:    Talks on phone: Not on file    Gets together: Not on file    Attends religious service: Not on file    Active member of club or organization: Not on file    Attends meetings of clubs or organizations: Not on file    Relationship status: Not on file  Other Topics Concern  . Not on file  Social History Narrative  . Not on file   Additional Social History:                         Sleep: Fair  Appetite:  Fair  Current Medications: Current Facility-Administered Medications  Medication Dose Route Frequency Provider Last Rate Last Dose  . acetaminophen (TYLENOL) tablet 650 mg  650 mg Oral Q6H PRN Clapacs, John T,  MD   650 mg at 08/09/18 2121  . alum & mag hydroxide-simeth (MAALOX/MYLANTA) 200-200-20 MG/5ML suspension 30 mL  30 mL Oral Q4H PRN Clapacs, John T, MD      . hydrOXYzine (ATARAX/VISTARIL) tablet 50 mg  50 mg Oral TID PRN Clapacs, Jackquline Denmark, MD   50 mg at 08/09/18 2121  . magnesium hydroxide (MILK OF MAGNESIA) suspension 30 mL  30 mL Oral Daily PRN Clapacs, John T, MD      . Melene Muller ON 08/14/2018] paliperidone (INVEGA SUSTENNA) injection 156 mg  156 mg Intramuscular Once McNew, Holly R, MD      . paliperidone (INVEGA) 24 hr tablet 6 mg  6 mg Oral Daily McNew, Ileene Hutchinson, MD   6 mg at 08/11/18 0755  . traZODone (DESYREL) tablet 100 mg  100 mg Oral QHS PRN Clapacs, Jackquline Denmark, MD   100 mg at 08/10/18 2212    Lab Results: No results found for this or any previous visit (from the past 48 hour(s)).  Blood Alcohol level:  Lab Results  Component Value Date   ETH <10  08/04/2018    Metabolic Disorder Labs: No results found for: HGBA1C, MPG No results found for: PROLACTIN No results found for: CHOL, TRIG, HDL, CHOLHDL, VLDL, LDLCALC  Physical Findings: AIMS: Facial and Oral Movements Muscles of Facial Expression: None, normal Lips and Perioral Area: None, normal Jaw: None, normal Tongue: None, normal,Extremity Movements Upper (arms, wrists, hands, fingers): None, normal Lower (legs, knees, ankles, toes): None, normal, Trunk Movements Neck, shoulders, hips: None, normal, Overall Severity Severity of abnormal movements (highest score from questions above): None, normal Incapacitation due to abnormal movements: None, normal Patient's awareness of abnormal movements (rate only patient's report): No Awareness, Dental Status Current problems with teeth and/or dentures?: No Does patient usually wear dentures?: No  CIWA:    COWS:     Musculoskeletal: Strength & Muscle Tone: within normal limits Gait & Station: normal Patient leans: N/A  Psychiatric Specialty Exam: Physical Exam  Nursing note and vitals reviewed.   Review of Systems  All other systems reviewed and are negative.   Blood pressure 109/61, pulse (!) 53, temperature 97.8 F (36.6 C), temperature source Oral, resp. rate 16, height 5\' 10"  (1.778 m), weight 70.8 kg, SpO2 100 %.Body mass index is 22.38 kg/m.  General Appearance: Casual  Eye Contact:  Fair  Speech:  Clear and Coherent  Volume:  Normal  Mood:  Euthymic  Affect:  Constricted  Thought Process:  Coherent and Goal Directed  Orientation:  Full (Time, Place, and Person)  Thought Content:  Logical but very vague  Suicidal Thoughts:  No  Homicidal Thoughts:  No  Memory:  Immediate;   Fair  Judgement:  Impaired  Insight:  Lacking  Psychomotor Activity:  Normal  Concentration:  Concentration: Fair  Recall:  [employer] of Knowledge:  Fair  Language:  Fair  Akathisia:  No      Assets:  Resilience  ADL's:  Intact   Cognition:  WNL  Sleep:  Number of Hours: 6.45     Treatment Plan Summary: 22 yo male admitted due to mania. Symptoms are improving and less bizzare in conversation He also denies AH or VH. He is homeless and his mother is not wiling to allow him home. He does not want to go to [place] where he has grandparents. Unfortunately, he does not have funding to go to a group home.   Plan:  Mood/psychosis -Will get 2nd dose of  Gean Birchwood on Thursday -Continue oral Invega until he receives 2nd dose  Elevated LFts -GI consulted. No further workup needed  Dispo -He is homeless and lacks income for group home. He will have to discharge to shelter Haskell Riling, MD 08/11/2018, 1:32 PM

## 2018-08-11 NOTE — Plan of Care (Signed)
Pt. Verbalizes understanding of provided education. Pt. Denies SI/HI, verbally contracts for safety. Pt. Pleasant and cooperative, no behavior issues.    Problem: Education: Goal: Knowledge of [facility] Education information/materials will improve Outcome: Progressing Goal: Emotional status will improve Outcome: Progressing Goal: Mental status will improve Outcome: Progressing   Problem: Safety: Goal: Ability to remain free from injury will improve Outcome: Progressing   Problem: Safety: Goal: Ability to remain free from injury will improve Outcome: Progressing

## 2018-08-11 NOTE — BHH Counselor (Signed)
CSW spoke with the patient about discharge plans. The patient reports that going to his grandparents house out of state is not an option for him. He reports not wanting to leave [place] as his reason. He reports that he plans to go to the shelter at discharge. He has not called the shelter yet to let them know that he was in the hospital. CSW encouraged him to call the shelter to inform them as well as consider going to live with his grandparents. Patient endorsed that he will consider it and will let the CSW know tomorrow.  Johny Shears, MSW, Theresia Majors, Bridget Hartshorn Clinical Social Worker 08/11/2018 3:02 PM

## 2018-08-11 NOTE — Plan of Care (Signed)
Patient is pleasant and cooperative In the unit.Denies SI,HI and AVH.Patient played basket ball outside & states "I feel so good."Compliant with medications.Attended groups.Appetite and energy level good.Support and encouragement given.

## 2018-08-11 NOTE — B[facility] Group Notes (Signed)
B[facility] Group Notes:  (Nursing/MHT/Case Management/Adjunct)  Date:  08/11/2018  Time:  9:59 PM  Type of Therapy:  Group Therapy  Participation Level:  Active  Participation Quality:  Appropriate  Affect:  Appropriate  Cognitive:  Appropriate  Insight:  Appropriate  Engagement in Group:  Engaged  Modes of Intervention:  Discussion  Summary of Progress/Problems: Larry Walton attended group. Larry Walton stated his goal was to write more and he accomplished this goal. Larry Walton completed his self-inventory sheet. Larry Walton reports he is sleeping and eating good. No reports of depression or anxiety. No harmful ideation to self or others. Reports little to no pain. Goal is to work on Financial controller for basketball. MHT reviewed rules and expectations of the unit. MHT processed with patients about making routine checks throughout the night. MHT encouraged patients to complete self-inventory sheet. MHT processed with patients about seeking outpatient treatment when discharged from the unit. MHT explained the expected benefits of working with a mental health provider in the community. MHT encouraged patients to comply with doctor recommendations and follow up appointments.  Jinger Neighbors 08/11/2018, 9:59 PM

## 2018-08-12 NOTE — Plan of Care (Signed)
Patient states that he likes to write songs and he writes about how the world work and his feelings.Pleasant and cooperative in the unit.Compliant with medications.Attended groups.Appetite and energy level good.Support and encouragement given.

## 2018-08-12 NOTE — Progress Notes (Signed)
D: Pt denies SI/HI/AVH, contracts for safety. Pt is pleasant and cooperative. Pt. has no Complaints.  Patient Interactions on the unit with staff and peers appropriate. Pt. Has no observable behavioral issues. No observations of responding to internal stimuli. Pt. Verbalizes more, less guarded. Still a bit animated at times, but overall mood is good.   A: Q x 15 minute observation checks were completed for safety. Patient was provided with education.  Patient was given/offered medications per orders. Patient  was encourage to attend groups, participate in unit activities and continue with plan of care. Pt. Chart and plans of care reviewed. Pt. Given support and encouragement.   R: Patient is complaint with medication and unit procedures.             Precautionary checks every 15 minutes for safety maintained, room free of safety hazards, patient sustains no injury or falls during this shift. Will endorse care to next shift.

## 2018-08-12 NOTE — Plan of Care (Signed)
Pt. Verbalizes understanding of provided education. Pt. Pleasant and cooperative, no behavioral issues on the unit. Pt. Verbalizes more this evening, but can be guarded still at times. Pt. Denies SI/HI, verbally is able to contract for safety.    Problem: Education: Goal: Knowledge of [facility] Education information/materials will improve Outcome: Progressing Goal: Emotional status will improve Outcome: Progressing Goal: Mental status will improve Outcome: Progressing   Problem: Coping: Goal: Will verbalize feelings Outcome: Progressing   Problem: Safety: Goal: Ability to remain free from injury will improve Outcome: Progressing   Problem: Safety: Goal: Ability to remain free from injury will improve Outcome: Progressing

## 2018-08-12 NOTE — B[facility] Group Notes (Signed)
08/12/2018 1PM  Type of Therapy/Topic:  Group Therapy:  Feelings about Diagnosis  Participation Level:  Active   Description of Group:   This group will allow patients to explore their thoughts and feelings about diagnoses they have received. Patients will be guided to explore their level of understanding and acceptance of these diagnoses. Facilitator will encourage patients to process their thoughts and feelings about the reactions of others to their diagnosis and will guide patients in identifying ways to discuss their diagnosis with significant others in their lives. This group will be process-oriented, with patients participating in exploration of their own experiences, giving and receiving support, and processing challenge from other group members.   Therapeutic Goals: 1. Patient will demonstrate understanding of diagnosis as evidenced by identifying two or more symptoms of the disorder 2. Patient will be able to express two feelings regarding the diagnosis 3. Patient will demonstrate their ability to communicate their needs through discussion and/or role play  Summary of Patient Progress: Actively and appropriately engaged in the group. Patient was able to provide support and validation to other group members.Patient practiced active listening when interacting with the facilitator and other group members. Larry Walton spoke about his fear of spiders and how is causes him to have anxiety. He was able to give great examples of how therapy has been helpful to him thus far in his treatment. Patient is still in the process of obtaining treatment goals.        Therapeutic Modalities:   Cognitive Behavioral Therapy Brief Therapy Feelings Identification    Larry Walton, [place] Mt 08/12/2018 3:36 PM

## 2018-08-12 NOTE — Progress Notes (Signed)
[facility] MD Progress Note  08/12/2018 1:38 PM Larry Walton  MRN:  [id number] Subjective:  Pt has been very calm and pleasant on the unit. NO outward bizzare behaviors. He is still very odd in his interactions with this provider. He is very vague with all responses. When asked about AH, he states, 'Yeah." When asked what they say, he says "yeah." He is unable to elaborate any further than that. He states that they are not bothersome at all and are actually helpful. He denies that they tell him to harm himself or others. He also reports vague "figures.' He states, "they help me to see." he does not elaborate and then breaks out into laughter. He is overall organized and taking care of himself on the unit. He did not call the shelter and cannot give a reason why. Strongly encouraged him to do so. He states that he may be able to stay with friends if the shelter does not work out for him. He denies SI or HI. He is fully oriented.   Principal Problem: Severe manic bipolar 1 disorder with psychotic behavior (HCC) Diagnosis:   Patient Active Problem List   Diagnosis Date Noted  . Severe manic bipolar 1 disorder with psychotic behavior (HCC) [F31.2] 08/06/2018    Priority: High   Total Time spent with patient: 20 minutes  Past Psychiatric History: See H&P  Past Medical History: History reviewed. No pertinent past medical history.  Past Surgical History:  Procedure Laterality Date  . ARTHROSCOPIC REPAIR ACL     left   Family History: History reviewed. No pertinent family history. Family Psychiatric  History: See H&P Social History:  Social History   Substance and Sexual Activity  Alcohol Use No     Social History   Substance and Sexual Activity  Drug Use No    Social History   Socioeconomic History  . Marital status: Single    Spouse name: Not on file  . Number of children: Not on file  . Years of education: Not on file  . Highest education level: Not on file  Occupational History  . Not  on file  Social Needs  . Financial resource strain: Not on file  . Food insecurity:    Worry: Not on file    Inability: Not on file  . Transportation needs:    Medical: Not on file    Non-medical: Not on file  Tobacco Use  . Smoking status: Never Smoker  . Smokeless tobacco: Never Used  Substance and Sexual Activity  . Alcohol use: No  . Drug use: No  . Sexual activity: Not on file  Lifestyle  . Physical activity:    Days per week: Not on file    Minutes per session: Not on file  . Stress: Not on file  Relationships  . Social connections:    Talks on phone: Not on file    Gets together: Not on file    Attends religious service: Not on file    Active member of club or organization: Not on file    Attends meetings of clubs or organizations: Not on file    Relationship status: Not on file  Other Topics Concern  . Not on file  Social History Narrative  . Not on file   Additional Social History:                         Sleep: Good  Appetite:  Good  Current Medications:  Current Facility-Administered Medications  Medication Dose Route Frequency Provider Last Rate Last Dose  . acetaminophen (TYLENOL) tablet 650 mg  650 mg Oral Q6H PRN Clapacs, Jackquline Denmark, MD   650 mg at 08/09/18 2121  . alum & mag hydroxide-simeth (MAALOX/MYLANTA) 200-200-20 MG/5ML suspension 30 mL  30 mL Oral Q4H PRN Clapacs, John T, MD      . hydrOXYzine (ATARAX/VISTARIL) tablet 50 mg  50 mg Oral TID PRN Clapacs, Jackquline Denmark, MD   50 mg at 08/09/18 2121  . magnesium hydroxide (MILK OF MAGNESIA) suspension 30 mL  30 mL Oral Daily PRN Clapacs, John T, MD      . Melene Muller ON 08/14/2018] paliperidone (INVEGA SUSTENNA) injection 156 mg  156 mg Intramuscular Once Sharine Cadle R, MD      . paliperidone (INVEGA) 24 hr tablet 6 mg  6 mg Oral Daily Ardie Dragoo, Ileene Hutchinson, MD   6 mg at 08/12/18 0844  . traZODone (DESYREL) tablet 100 mg  100 mg Oral QHS PRN Clapacs, Jackquline Denmark, MD   100 mg at 08/10/18 2212    Lab Results: No  results found for this or any previous visit (from the past 48 hour(s)).  Blood Alcohol level:  Lab Results  Component Value Date   ETH <10 08/04/2018    Metabolic Disorder Labs: No results found for: HGBA1C, MPG No results found for: PROLACTIN No results found for: CHOL, TRIG, HDL, CHOLHDL, VLDL, LDLCALC  Physical Findings: AIMS: Facial and Oral Movements Muscles of Facial Expression: None, normal Lips and Perioral Area: None, normal Jaw: None, normal Tongue: None, normal,Extremity Movements Upper (arms, wrists, hands, fingers): None, normal Lower (legs, knees, ankles, toes): None, normal, Trunk Movements Neck, shoulders, hips: None, normal, Overall Severity Severity of abnormal movements (highest score from questions above): None, normal Incapacitation due to abnormal movements: None, normal Patient's awareness of abnormal movements (rate only patient's report): No Awareness, Dental Status Current problems with teeth and/or dentures?: No Does patient usually wear dentures?: No  CIWA:    COWS:     Musculoskeletal: Strength & Muscle Tone: within normal limits Gait & Station: normal Patient leans: N/A  Psychiatric Specialty Exam: Physical Exam  Nursing note and vitals reviewed.   Review of Systems  All other systems reviewed and are negative.   Blood pressure (!) 104/52, pulse 60, temperature 98 F (36.7 C), temperature source Oral, resp. rate 18, height 5\' 10"  (1.778 m), weight 70.8 kg, SpO2 100 %.Body mass index is 22.38 kg/m.  General Appearance: Casual  Eye Contact:  Minimal  Speech:  Clear and Coherent  Volume:  Normal  Mood:  Euthymic  Affect:  Inappropriate, laughs inappropriately  Thought Process:  Coherent and Goal Directed, odd responses at times  Orientation:  Full (Time, Place, and Person)  Thought Content:  Illogical  Suicidal Thoughts:  No  Homicidal Thoughts:  No  Memory:  NA  Judgement:  Impaired  Insight:  Lacking  Psychomotor Activity:   Normal  Concentration:  Concentration: Poor  Recall:  Poor  Fund of Knowledge:  Fair  Language:  Fair  Akathisia:  No      Assets:  Resilience  ADL's:  Intact  Cognition:  WNL  Sleep:  Number of Hours: 7.3     Treatment Plan Summary: 22 yo male admitted due to psychosis and mania. He does not appear manic and is sleeping much better. However, he still has some odd responses to questions and appears to respond to internal stimuli at times. He has  been calm and appropriate on the unit. He is sleeping better. He is caring okay for himself.  Plan:  Bipolar disorder vs. Schizoaffective disorder -Continue oral Invega 6 mg daily -Will receive 2nd injection of Invega 156 mg on Thursday  Elevated LFts -GI consulted. No further workup needed  Dispo -He is homeless and lacks income for group home. He will have to discharge to shelter. His mother will not allow him to come live with her Haskell Riling, MD 08/12/2018, 1:38 PM

## 2018-08-13 DIAGNOSIS — F259 Schizoaffective disorder, unspecified: Secondary | ICD-10-CM

## 2018-08-13 DIAGNOSIS — F250 Schizoaffective disorder, bipolar type: Secondary | ICD-10-CM

## 2018-08-13 NOTE — Progress Notes (Signed)
Patient alert and oriented x 4, denies SI/HI/AVH no psychosis noted interacting appropriately with peers  and staff, eye contact is appropriate, no distress noted, thoughts are organized and coherent, 15 minutes safety checks maintained will continue to monitor.

## 2018-08-13 NOTE — Plan of Care (Signed)
  Problem: Coping: Goal: Coping ability will improve Outcome: Progressing  Patient is coping effectively   

## 2018-08-13 NOTE — Progress Notes (Signed)
Recreation Therapy Notes  Date: 08/13/2018  Time: 9:30 am  Location: Craft Room  Behavioral response: Appropriate   Intervention Topic: Relaxation  Discussion/Intervention:  Group content today was focused on relaxation. The group defined relaxation and identified healthy ways to relax. Individuals expressed how much time they spend relaxing. Patients expressed how much their life would be if they did not make time for themselves to relax. The group stated ways they could improve their relaxation techniques in the future.  Individuals participated in the intervention "Time to Relax" where they had a chance to experience different relaxation techniques.  Clinical Observations/Feedback:  Patient came to group and defined relaxation as calm and peace. He identified exercise and reading as relaxing activities he participates in. Individual was social with peers and staff while participating in the intervention.  Sarayah Bacchi LRT/CTRS         Korra Christine 08/13/2018 11:50 AM

## 2018-08-13 NOTE — Progress Notes (Signed)
D- Patient alert and oriented. Patient presents in a pleasant mood on assessment stating that he slept "well" last night and had no major complaints to voice to this Clinical research associate. Patient denies SI, HI, AVH, and pain at this time. Patient denies depression and anxiety stating to this writer "I'm really happy". Patient's goal for today is to "continue to make more thoughts on my game and revise my previous thoughts".   A- Scheduled medications administered to patient, per MD orders. Support and encouragement provided.  Routine safety checks conducted every 15 minutes.  Patient informed to notify staff with problems or concerns.  R- No adverse drug reactions noted. Patient contracts for safety at this time. Patient compliant with medications and treatment plan. Patient receptive, calm, and cooperative. Patient interacts well with others on the unit.  Patient remains safe at this time.

## 2018-08-13 NOTE — Progress Notes (Signed)
[facility] MD Progress Note  08/13/2018 1:32 PM Larry Walton  MRN:  [id number] Subjective:  Per RN staff, pt has been very calm and cooperative on the unit. He has been very organized and appropriate on the unit with organized thoughts per staff. Upon evaluation today, he is very calm. He states that he has been thinking a lot about politics which is stressful at time. He does seem to have some thought blocking at times and appears to be listening to voices at times through the interview. However, he states that the [facility] are not there today and states, "I'm glad they aren't there today." He states that when he does hear them they do not command him to do anything. He also denies VH today and "They are only there when I'm reading." When asked what is better since initial part of hospitalization, he states, I think I can voice my options better now. I also have reactivated some stuff like writing, reading and shooting basketball." he states that he is sleeping better and feels his mood is stable. He states, "I feel like I got my sad out." He denies feeling depressed or sad now. He states that his goals when he leaves is to "still write and do things I enjoy like play games." He also wants to work. He states that he feels okay with going to a shelter.r He states that he will consider going to his grandparents in [place] but this is not something he wants to do right now. He states that he has friends in [place]. He laughs at times inappropriately. He states, "i laugh too much and sometimes people think that is weird." When asked, he denies SI or HI.   Principal Problem: Severe manic bipolar 1 disorder with psychotic behavior (HCC) Diagnosis:   Patient Active Problem List   Diagnosis Date Noted  . Severe manic bipolar 1 disorder with psychotic behavior (HCC) [F31.2] 08/06/2018    Priority: High   Total Time spent with patient: 20 minutes  Past Psychiatric History: See H&p  Past Medical History: History reviewed.  No pertinent past medical history.  Past Surgical History:  Procedure Laterality Date  . ARTHROSCOPIC REPAIR ACL     left   Family History: History reviewed. No pertinent family history. Family Psychiatric  History: See H&P Social History:  Social History   Substance and Sexual Activity  Alcohol Use No     Social History   Substance and Sexual Activity  Drug Use No    Social History   Socioeconomic History  . Marital status: Single    Spouse name: Not on file  . Number of children: Not on file  . Years of education: Not on file  . Highest education level: Not on file  Occupational History  . Not on file  Social Needs  . Financial resource strain: Not on file  . Food insecurity:    Worry: Not on file    Inability: Not on file  . Transportation needs:    Medical: Not on file    Non-medical: Not on file  Tobacco Use  . Smoking status: Never Smoker  . Smokeless tobacco: Never Used  Substance and Sexual Activity  . Alcohol use: No  . Drug use: No  . Sexual activity: Not on file  Lifestyle  . Physical activity:    Days per week: Not on file    Minutes per session: Not on file  . Stress: Not on file  Relationships  . Social connections:  Talks on phone: Not on file    Gets together: Not on file    Attends religious service: Not on file    Active member of club or organization: Not on file    Attends meetings of clubs or organizations: Not on file    Relationship status: Not on file  Other Topics Concern  . Not on file  Social History Narrative  . Not on file   Additional Social History:                         Sleep: Fair  Appetite:  Good  Current Medications: Current Facility-Administered Medications  Medication Dose Route Frequency Provider Last Rate Last Dose  . acetaminophen (TYLENOL) tablet 650 mg  650 mg Oral Q6H PRN Clapacs, Jackquline Denmark, MD   650 mg at 08/09/18 2121  . alum & mag hydroxide-simeth (MAALOX/MYLANTA) 200-200-20 MG/5ML  suspension 30 mL  30 mL Oral Q4H PRN Clapacs, John T, MD      . hydrOXYzine (ATARAX/VISTARIL) tablet 50 mg  50 mg Oral TID PRN Clapacs, Jackquline Denmark, MD   50 mg at 08/09/18 2121  . magnesium hydroxide (MILK OF MAGNESIA) suspension 30 mL  30 mL Oral Daily PRN Clapacs, John T, MD      . Melene Muller ON 08/14/2018] paliperidone (INVEGA SUSTENNA) injection 156 mg  156 mg Intramuscular Once Letina Luckett R, MD      . paliperidone (INVEGA) 24 hr tablet 6 mg  6 mg Oral Daily Maeson Purohit, Ileene Hutchinson, MD   6 mg at 08/13/18 0852  . traZODone (DESYREL) tablet 100 mg  100 mg Oral QHS PRN Clapacs, Jackquline Denmark, MD   100 mg at 08/10/18 2212    Lab Results: No results found for this or any previous visit (from the past 48 hour(s)).  Blood Alcohol level:  Lab Results  Component Value Date   ETH <10 08/04/2018    Metabolic Disorder Labs: No results found for: HGBA1C, MPG No results found for: PROLACTIN No results found for: CHOL, TRIG, HDL, CHOLHDL, VLDL, LDLCALC  Physical Findings: AIMS: Facial and Oral Movements Muscles of Facial Expression: None, normal Lips and Perioral Area: None, normal Jaw: None, normal Tongue: None, normal,Extremity Movements Upper (arms, wrists, hands, fingers): None, normal Lower (legs, knees, ankles, toes): None, normal, Trunk Movements Neck, shoulders, hips: None, normal, Overall Severity Severity of abnormal movements (highest score from questions above): None, normal Incapacitation due to abnormal movements: None, normal Patient's awareness of abnormal movements (rate only patient's report): No Awareness, Dental Status Current problems with teeth and/or dentures?: No Does patient usually wear dentures?: No  CIWA:    COWS:     Musculoskeletal: Strength & Muscle Tone: within normal limits Gait & Station: normal Patient leans: N/A  Psychiatric Specialty Exam: Physical Exam  Nursing note and vitals reviewed.   Review of Systems  All other systems reviewed and are negative.   Blood  pressure 119/75, pulse (!) 101, temperature 98.2 F (36.8 C), temperature source Oral, resp. rate 18, height 5\' 10"  (1.778 m), weight 70.8 kg, SpO2 100 %.Body mass index is 22.38 kg/m.  General Appearance: Casual  Eye Contact:  Fair  Speech:  Clear and Coherent  Volume:  Decreased  Mood:  Euthymic  Affect:  Constricted  Thought Process:  Thought blocking  Orientation:  Full (Time, Place, and Person)  Thought Content:  Logical  Suicidal Thoughts:  No  Homicidal Thoughts:  No  Memory:  Immediate;  Fair  Judgement:  Impaired  Insight:  Lacking  Psychomotor Activity:  Normal  Concentration:  Concentration: Poor  Recall:  [employer] of Knowledge:  Fair  Language:  Fair  Akathisia:  No      Assets:  Resilience  ADL's:  Intact  Cognition:  WNL  Sleep:  Number of Hours: 5.75     Treatment Plan Summary: 22 yo male admitted due to psychosis and mania. He still still has some thought blocking and odd responses to questions but overall organized in thoughts. He has been very calm and polite on the unit. HE is caring for himself adequately. He is sleeping much better and does not appear mania. Will change his diagnosis to schizoaffective disorder as he does have some residual psychosis currently even since mania has resolved. He has been taking medications and will get second dose of [place] tomorrow.   Plan:  Schizoaffective disorder -Continue oral Invega 6 mg. May stop tomorrow after 2nd injection -Will receive second injection of Invega Sustenna 156 mg tomorrow  Elevated LFTs -GI consulted and no further workup needed  Marijuana use -Discussed again today about abstaining from use due to risk of causing or worsening psychosis  Dispo -Likely discharge tomorrow to shelter. Unfortunately he has no funding to go to a group home and he does not want to live with his grandparents in [place].   Haskell Riling, MD 08/13/2018, 1:32 PM

## 2018-08-13 NOTE — Plan of Care (Signed)
Patient verbalizes understanding of the general information that's been provided to him and has not voiced any further questions or concerns at this time. Patient denies SI/HI/AVH as well as any signs/symptoms of depression and anxiety to this Clinical research associate. Patient has been observed out in the milieu as well as attending and participating in unit groups without any issues. Patient's goal for today is to"continue to make more thoughts on my game and revise my previous thoughts". Patient has the ability to redirect his hostility/anger into appropriate behaviors. Patient has remained free from injury thus far and is safe on the unit at this time.  Problem: Education: Goal: Knowledge of [place] Education information/materials will improve Outcome: Progressing Goal: Emotional status will improve Outcome: Progressing Goal: Mental status will improve Outcome: Progressing Goal: Verbalization of understanding the information provided will improve Outcome: Progressing   Problem: Coping: Goal: Coping ability will improve Outcome: Progressing Goal: Will verbalize feelings Outcome: Progressing   Problem: Safety: Goal: Ability to redirect hostility and anger into socially appropriate behaviors will improve Outcome: Progressing Goal: Ability to remain free from injury will improve Outcome: Progressing   Problem: Safety: Goal: Ability to remain free from injury will improve Outcome: Progressing

## 2018-08-14 MED ORDER — PALIPERIDONE PALMITATE ER 156 MG/ML IM SUSY: 156.0000 mg | PREFILLED_SYRINGE | Freq: Once | INTRAMUSCULAR | 1 refills | Status: AC

## 2018-08-14 NOTE — [facility] Suicide Risk Assessment (Signed)
[facility] Discharge Suicide Risk Assessment   Principal Problem: Schizoaffective disorder [facility]) Discharge Diagnoses:  Patient Active Problem List   Diagnosis Date Noted  . Schizoaffective disorder (HCC) [F25.9] 08/13/2018    Priority: High   Mental Status Per Nursing Assessment::   On Admission:  NA  Demographic Factors:  Male, Unemployed and Homeless  Loss Factors: Decrease in vocational status, Decline in physical health and Financial problems/change in socioeconomic status  Historical Factors: NA  Risk Reduction Factors:   Positive coping skills or problem solving skills  Continued Clinical Symptoms:  Schizophrenia:   Less than 20 years old Paranoid or undifferentiated type  Cognitive Features That Contribute To Risk:  None    Suicide Risk:  Minimal Acute Risk: No identifiable suicidal ideation.    Follow-up Information    [employer] Go on 08/20/2018.   Why:  Please follow up on Wednesday August 20, 2018 with Unk Pinto for Peer Support sevices. He will pick you up at 7am. His contact number is [phone]. Please bring your ID, insurance information, discharge summary and medications. Thank you. Contact information: [address] [place] [address] [phone]            Haskell Riling, MD 08/14/2018, 9:18 AM

## 2018-08-14 NOTE — B[facility] Group Notes (Signed)
LCSW Group Therapy Note  08/14/2018  1:00pm  Type of Therapy/Topic:  Group Therapy:  Balance in Life  Participation Level:  Did Attend  Description of Group:    This group will address the concept of balance and how it feels and looks when one is unbalanced. Patients will be encouraged to process areas in their lives that are out of balance and identify reasons for remaining unbalanced. Facilitators will guide patients in utilizing problem-solving interventions to address and correct the stressor making their life unbalanced. Understanding and applying boundaries will be explored and addressed for obtaining and maintaining a balanced life. Patients will be encouraged to explore ways to assertively make their unbalanced needs known to significant others in their lives, using other group members and facilitator for support and feedback.  Therapeutic Goals: 1. Patient will identify two or more emotions or situations they have that consume much of in their lives. 2. Patient will identify signs/triggers that life has become out of balance:  3. Patient will identify two ways to set boundaries in order to achieve balance in their lives:  4. Patient will demonstrate ability to communicate their needs through discussion and/or role plays  Summary of Patient Progress:  This patient as a group collective was able to identify several factors that contribute to unbalanced life such as :No sleep, stopping medications, not exercising ,isolating from friends and family, drugs and alcohol abuse and use. In the next portion of group patient was encouraged to talk about things that contribute to balance  their life such as rest, harmony, seeing family, exercise,setting small goals, creating lists,keeping medical appointments, taking medications.  Therapeutic Modalities:   Cognitive Behavioral Therapy Solution-Focused Therapy Assertiveness Training  Dawt Reeb M, LCSW 08/14/2018 2:07 PM     LCSW Group Therapy Note  08/14/2018  1:00pm  Type of Therapy/Topic:  Group Therapy:  Balance in Life  Participation Level:  Did Attend  Description of Group:    This group will address the concept of balance and how it feels and looks when one is unbalanced. Patients will be encouraged to process areas in their lives that are out of balance and identify reasons for remaining unbalanced. Facilitators will guide patients in utilizing problem-solving interventions to address and correct the stressor making their life unbalanced. Understanding and applying boundaries will be explored and addressed for obtaining and maintaining a balanced life. Patients will be encouraged to explore ways to assertively make their unbalanced needs known to significant others in their lives, using other group members and facilitator for support and feedback.  Therapeutic Goals: 1. Patient will identify two or more emotions or situations they have that consume much of in their lives. 2. Patient will identify signs/triggers that life has become out of balance:  3. Patient will identify two ways to set boundaries in order to achieve balance in their lives:  4. Patient will demonstrate ability to communicate their needs through discussion and/or role plays  Summary of Patient Progress:  This patient as a group collective was able to identify several factors that contribute to unbalanced life such as :No sleep, stopping medications, not exercising ,isolating from friends and family, drugs and alcohol abuse and use. In the next portion of group patient was encouraged to talk about things that contribute to balance  their life such as rest, harmony, seeing family, exercise,setting small goals, creating lists,keeping medical appointments, taking medications.  Therapeutic Modalities:   Cognitive Behavioral Therapy Solution-Focused Therapy Assertiveness Training  Cheron Schaumann, [place] 08/14/2018 2:07 PM

## 2018-08-14 NOTE — Progress Notes (Signed)
Recreation Therapy Notes  Date: 08/14/2018  Time: 9:30 am  Location: Craft Room  Behavioral response: Appropriate   Intervention Topic: Communication  Discussion/Intervention:  Group content today was focused on communication. The group defined communication and ways to communicate with others. Individuals stated reason why communication is important and some reasons to communicate with others. Patients expressed if they thought they were good at communicating with others and ways they could improve their communication skills. The group identified important parts of communication and some experiences they have had in the past with communication. The group participated in the intervention "What is that?", where they had a chance to test out their communication skills and identify ways to improve their communication techniques.  Clinical Observations/Feedback:  Patient came to group and defined communication as voicing out. He identified using emotions and nonverbal communication as ways he communicates. Individual explained that it is important to communicate with others so they will have an understanding. Patient was social with peers and staff while participating in the intervention.  Silena Wyss LRT/CTRS         Malaiya Paczkowski 08/14/2018 11:02 AM

## 2018-08-14 NOTE — Discharge Summary (Signed)
Physician Discharge Summary Note  Patient:  Larry Walton is an 22 y.o., male MRN:  [id number] DOB:  July 08, 1996 Patient phone:  [phone] (home)  Patient address:   [address] [place] [address],  Total Time spent with patient: 15 minutes  Plus 20 minutes of medication reconciliation, discharge planning, and discharge documentation   Date of Admission:  08/05/2018 Date of Discharge: 08/14/18  Reason for Admission:  22 yo male admitted due to bizzare behaviors. He was brought in by police on 2/97. He was very disorganized in the ED. He was talking about "seeing into another space."  He was very restless and bizzare in ED and required IM medications. He was very incoherent with assessments and extremely disorganized in thoughts. He was talking about seeing a large spider on his shoulder and on his face. He reported the spider was "the size of an infant."             Upon evaluation today, he is initially organized and able to give history. However, as interview went on his thoughts became more bizzare and disorganized. He states that he has been staying at a shelter for 2 weeks. Prior to this, he was staying with his mother. He does not answer why he no longer lives with his mom. When asked why he came to the hospital, he states, "I freaked out a bit." He then states that he thought there was a big spider on his shoulder. He states that he now knows that there wasn't really spider on him. He states that he was at a park and saw a spider on the ground and then he was scared that it was crawling on him. He states "I threw off my shirt and shorts and started running." He then states that he went into the [facility] and "I was going to play ball with my friend and then the officers picked me up.' When asked why the officers picked him up, he states, "to see if they can follow up." Pts thoughts are very fragmented. When asked if he ever hears voices, he states, "possibly." He states "they are  in different pitches..in very different pitches." He states, "they can fade in and sneak up. They are good because they are harmonizing." He states that sometimes "they say you[re stupid." HE states that he knows these are not real. He denies any command hallucinations. HE also endorses VH of "formations. I guess they are there because I am older. They are [employer] games. Like [employer] games and Ghost [employer]." He reports his sleep is erratic. HE states that he has gone 2-3 days in the past without sleep at all. He states that during this time "I would go to work and then play games and not sleep." He states that he had a lot of energy during this time.  He states that he would then "get real sluggish. It made me see differently. Like I"m ahead of something." He also endorses vague paranoia "like someone is watching me. Specific people to make sure i'm okay."  Pt states that his goal is to make music videos and to become a Geophysicist/field seismologist. He states that he is writing down all of his ideas. He asked [place] in treatment team "how to get equipment on the unit or where the best place to shoot a video is." Pt adamantly denies SI or HI. He reports smoking 1-2 grams of marijuana a day. He then states that he does not smoke  everyday. When asked about other drugs, He states, "I used to smoke [place] but not anymore." Denies other drug use. He did take Risperdal last night and states, "It helped me sleep." Pt laughs and smiles inappropriately at times during interview. He is grandiose and euphoric.   Principal Problem: Schizoaffective disorder [facility]) Discharge Diagnoses: Patient Active Problem List   Diagnosis Date Noted  . Schizoaffective disorder ([place]) [F25.9] 08/13/2018    Priority: High    Past Psychiatric History: See H&P  Past Medical History: History reviewed. No pertinent past medical history.  Past Surgical History:  Procedure Laterality Date  . ARTHROSCOPIC REPAIR ACL     left   Family History:  History reviewed. No pertinent family history. Family Psychiatric  History: See H&P Social History:  Social History   Substance and Sexual Activity  Alcohol Use No     Social History   Substance and Sexual Activity  Drug Use No    Social History   Socioeconomic History  . Marital status: Single    Spouse name: Not on file  . Number of children: Not on file  . Years of education: Not on file  . Highest education level: Not on file  Occupational History  . Not on file  Social Needs  . Financial resource strain: Not on file  . Food insecurity:    Worry: Not on file    Inability: Not on file  . Transportation needs:    Medical: Not on file    Non-medical: Not on file  Tobacco Use  . Smoking status: Never Smoker  . Smokeless tobacco: Never Used  Substance and Sexual Activity  . Alcohol use: No  . Drug use: No  . Sexual activity: Not on file  Lifestyle  . Physical activity:    Days per week: Not on file    Minutes per session: Not on file  . Stress: Not on file  Relationships  . Social connections:    Talks on phone: Not on file    Gets together: Not on file    Attends religious service: Not on file    Active member of club or organization: Not on file    Attends meetings of clubs or organizations: Not on file    Relationship status: Not on file  Other Topics Concern  . Not on file  Social History Narrative  . Not on file    Hospital Course:  Pt was initially started on Risperdal and tolerated well. He lacked insight into mental illness and was homeless so was at high risk of noncompliance with medications. He agreed to getting on a LAI and was started on [place]. He received both initial injections while on the unit. HE was very calm and polite through hospitalization. He was odd in his responses at times but nothing dangerous. HE consistently denied SI or HI. He attended groups and interacted well with other peers. Next INvega dose due on 09/11/18. He met  with Lanae Boast and agreed to follow up with [facility]> On day of discharge, he was smiling and happy. HE states that he feels ready to get out of the hospital. He asked if he could continue to go to groups because he really enjoyed them. WE discussed taht [facility] has group therapy that he could get involved with. He denied SI or thoughts of self harm. He denied HI. He denied AH or VH today. He was organized in thoughts. Unfortunately, his mother was unable to have him return home so  pt planned to go to shelter on discharge. We continued to discuss abstaining from marijuana use in which he agreed to this. His hygiene was good and was adequately taking care of himself.   The patient is at low risk of imminent suicide. Patient denied thoughts, intent, or plan for harm to self or others, expressed significant future orientation, and expressed an ability to mobilize assistance for his needs. he is presently void of any contributing psychiatric symptoms, cognitive difficulties, or substance use which would elevate his risk for lethality. Chronic risk for lethality is elevated in light of homelessness. The chronic risk is presently mitigated by his ongoing desire and engagement in [facility] treatment and mobilization of support from family and friends. Chronic risk may elevate if he experiences any significant loss or worsening of symptoms, which can be managed and monitored through outpatient providers. At this time,a cute risk for lethality is low and he is stable for ongoing outpatient management.   Modifiable risk factors were addressed during this hospitalization through appropriate pharmacotherapy and establishment of outpatient follow-up treatment. Some risk factors for suicide are situational (i.e. Unstable housing) or related personality pathology (i.e. Poor coping mechanisms) and thus cannot be further mitigated by continued hospitalization in this setting.    Physical Findings: AIMS: Facial and Oral Movements Muscles of  Facial Expression: None, normal Lips and Perioral Area: None, normal Jaw: None, normal Tongue: None, normal,Extremity Movements Upper (arms, wrists, hands, fingers): None, normal Lower (legs, knees, ankles, toes): None, normal, Trunk Movements Neck, shoulders, hips: None, normal, Overall Severity Severity of abnormal movements (highest score from questions above): None, normal Incapacitation due to abnormal movements: None, normal Patient's awareness of abnormal movements (rate only patient's report): No Awareness, Dental Status Current problems with teeth and/or dentures?: No Does patient usually wear dentures?: No  CIWA:    COWS:     Musculoskeletal: Strength & Muscle Tone: within normal limits Gait & Station: normal Patient leans: N/A  Psychiatric Specialty Exam: Physical Exam  Nursing note and vitals reviewed.   Review of Systems  All other systems reviewed and are negative.   Blood pressure 117/70, pulse (!) 52, temperature 98.5 F (36.9 C), temperature source Oral, resp. rate 18, height '5\' 10"'$  (1.778 m), weight 70.8 kg, SpO2 97 %.Body mass index is 22.38 kg/m.  General Appearance: Casual  Eye Contact:  Good  Speech:  Clear and Coherent  Volume:  Normal  Mood:  Euthymic  Affect:  Appropriate  Thought Process:  Coherent and Goal Directed  Orientation:  Full (Time, Place, and Person)  Thought Content:  Logical  Suicidal Thoughts:  No  Homicidal Thoughts:  No  Memory:  Immediate;   Fair  Judgement:  Fair  Insight:  Lacking  Psychomotor Activity:  Normal  Concentration:  Attention Span: Fair  Recall:  [employer] of Knowledge:  Fair  Language:  Fair  Akathisia:  No      Assets:  Resilience  ADL's:  Intact  Cognition:  WNL  Sleep:  Number of Hours: 8.15     Have you used any form of tobacco in the last 30 days? (Cigarettes, Smokeless Tobacco, Cigars, and/or Pipes): No  Has this patient used any form of tobacco in the last 30 days? (Cigarettes, Smokeless  Tobacco, Cigars, and/or Pipes)No  Blood Alcohol level:  Lab Results  Component Value Date   ETH <10 35/46/5681    Metabolic Disorder Labs:  No results found for: HGBA1C, MPG No results found for: PROLACTIN No results  found for: CHOL, TRIG, HDL, CHOLHDL, VLDL, LDLCALC  See Psychiatric Specialty Exam and Suicide Risk Assessment completed by Attending Physician prior to discharge.  Discharge destination:  Other:  Shelter  Is patient on multiple antipsychotic therapies at discharge:  No   Has Patient had three or more failed trials of antipsychotic monotherapy by history:  No  Recommended Plan for Multiple Antipsychotic Therapies: NA  Discharge Instructions    Increase activity slowly   Complete by:  As directed      Allergies as of 08/14/2018   No Known Allergies     Medication List    STOP taking these medications   brompheniramine-pseudoephedrine-DM 30-2-10 MG/5ML syrup   ibuprofen 400 MG tablet Commonly known as:  ADVIL,MOTRIN   predniSONE 50 MG tablet Commonly known as:  DELTASONE     TAKE these medications     Indication  paliperidone 156 MG/ML Susy injection Commonly known as:  INVEGA SUSTENNA Inject 1 mL (156 mg total) into the muscle once for 1 dose. Next dose due 09/11/18 Start taking on:  09/11/2018  Indication:  Schizoaffective Disorder      Follow-up [facility] [place] on 08/20/2018.   Why:  Please follow up on Wednesday August 20, 2018 with Sherrian Divers for Peer Support sevices. He will pick you up at 7am. His contact number is [phone]. Please bring your ID, insurance information, discharge summary and medications. Thank you. Contact information: [place] [address] [phone]             Signed: Marylin Crosby, MD 08/14/2018, 3:26 PM

## 2018-08-14 NOTE — Progress Notes (Signed)
D- Patient alert and oriented. Patient presents in a pleasant mood on assessment stating that he slept good last night and had no major complaints to voice to this Clinical research associate. Patient denies SI, HI, AVH, and pain at this time. Patient also denies depression and anxiety stating to this writer that "I'm feeling good". Patient had no stated goals for today, although he reported on his self-inventory that his goal os to "keeping organized and highlighting some words, I need to flow better when I rap/sing them" .  A- Scheduled medications administered to patient, per MD orders. Support and encouragement provided.  Routine safety checks conducted every 15 minutes.  Patient informed to notify staff with problems or concerns.  R- No adverse drug reactions noted. Patient contracts for safety at this time. Patient compliant with medications and treatment plan. Patient receptive, calm, and cooperative. Patient interacts well with others on the unit.  Patient remains safe at this time.

## 2018-08-14 NOTE — Progress Notes (Signed)
Recreation Therapy Notes  INPATIENT RECREATION TR PLAN  Patient Details Name: Larry Walton MRN: [id number] DOB: [birth year] Today's Date: 08/14/2018  Rec Therapy Plan Is patient appropriate for Therapeutic Recreation?: Yes Treatment times per week: at leats 3 Estimated Length of Stay: 5-7 days TR Treatment/Interventions: Group participation (Comment)  Discharge Criteria Pt will be discharged from therapy if:: Discharged Treatment plan/goals/alternatives discussed and agreed upon by:: Patient/family  Discharge Summary Short term goals set: Patient will engage in groups without prompting or encouragement from LRT x3 group sessions within 5 recreation therapy group sessions Short term goals met: Complete Progress toward goals comments: Groups attended Which groups?: Communication, Self-esteem(Relaxation, creative expresssion, Problem Solving) Reason goals not met: N/A Therapeutic equipment acquired: N/A Reason patient discharged from therapy: Discharge from hospital Pt/family agrees with progress & goals achieved: Yes Date patient discharged from therapy: 08/14/18   Eesha Schmaltz 08/14/2018, 2:19 PM

## 2018-08-14 NOTE — Progress Notes (Signed)
  [facility] Adult Case Management Discharge Plan :  Will you be returning to the same living situation after discharge:  Yes,  Veterinary surgeon At discharge, do you have transportation home?: Yes,  Taxi Voucher provided Do you have the ability to pay for your medications: Yes,  Insurance  Release of information consent forms completed and in the chart;  Patient's signature needed at discharge.  Patient to Follow up at: Follow-up Information    [facility] Go on 08/20/2018.   Why:  Please follow up on Wednesday August 20, 2018 with Unk Pinto for Peer Support sevices. He will pick you up at 7am. His contact number is [phone]. Please bring your ID, insurance information, discharge summary and medications. Thank you. Contact information: [address] [place] [address] [phone]           Next level of care provider has access to [facility] Link:no  Safety Planning and Suicide Prevention discussed: Yes,  Completed with patient and his mother  Have you used any form of tobacco in the last 30 days? (Cigarettes, Smokeless Tobacco, Cigars, and/or Pipes): No  Has patient been referred to the Quitline?: N/A patient is not a smoker  Patient has been referred for addiction treatment: N/A  Suzan Slick, LCSW 08/14/2018, 9:42 AM

## 2018-08-14 NOTE — Progress Notes (Signed)
D:Patient denies SI/HI/AVH at this time, contracts verbally for safety. Pt appears calm and cooperative, and no distress noted. Pt. Given extensive discharge education.   A: All Personal items in locker returned to pt. Pt escorted out of the building by staff RN and BHT. Pt. Escorted to taxi services to take him to his destination.   R:  Pt States he will comply with outpatient services, and take MEDS as prescribed.

## 2018-08-14 NOTE — Progress Notes (Signed)
Patient alert and oriented x 4, denies SI/HI/AVH, thoughts are organized and coherent, speech is soft and non pressured, no bizarre behavior noted during this shift, interacting appropriately with peers and staff, eye contact is appropriate, compliant with medication and receptive to treatment plan. Patient was visible in the unit and dayroom during wrap up group no distress noted. 15 minutes safety checks maintained will continue to monitor.

## 2018-08-14 NOTE — Plan of Care (Signed)
  Problem: Coping: Goal: Coping ability will improve Outcome: Progressing  Patient is coping effectively no distress noted, no s/s of psychosis.

## 2020-08-12 ENCOUNTER — Other Ambulatory Visit: Payer: Self-pay

## 2020-08-12 ENCOUNTER — Emergency Department
Admission: EM | Admit: 2020-08-12 | Discharge: 2020-08-13 | Disposition: A | Discharge status: Home / Self Care | Payer: Self-pay | Attending: Emergency Medicine | Admitting: Emergency Medicine

## 2020-08-12 DIAGNOSIS — R45851 Suicidal ideations: Secondary | ICD-10-CM | POA: Insufficient documentation

## 2020-08-12 DIAGNOSIS — F259 Schizoaffective disorder, unspecified: Secondary | ICD-10-CM | POA: Insufficient documentation

## 2020-08-12 DIAGNOSIS — Z20822 Contact with and (suspected) exposure to covid-19: Secondary | ICD-10-CM | POA: Insufficient documentation

## 2020-08-12 LAB — ACETAMINOPHEN LEVEL: Acetaminophen (Tylenol), Serum: <10 ug/mL — ABNORMAL LOW (ref 10–30)

## 2020-08-12 LAB — COMPREHENSIVE METABOLIC PANEL
ALT: 21 U/L (ref 0–44)
AST: 29 U/L (ref 15–41)
Albumin: 4.5 g/dL (ref 3.5–5.0)
Alkaline Phosphatase: 75 U/L (ref 38–126)
Anion gap: 7 (ref 5–15)
BUN: 14 mg/dL (ref 6–20)
CO2: 27 mmol/L (ref 22–32)
Calcium: 9 mg/dL (ref 8.9–10.3)
Chloride: 105 mmol/L (ref 98–111)
Creatinine, Ser: 1.14 mg/dL (ref 0.61–1.24)
GFR, Estimated: >60 mL/min (ref >60)
Glucose, Bld: 102 mg/dL — ABNORMAL HIGH (ref 70–99)
Potassium: 3.9 mmol/L (ref 3.5–5.1)
Sodium: 139 mmol/L (ref 135–145)
Total Bilirubin: 0.4 mg/dL (ref 0.3–1.2)
Total Protein: 7.5 g/dL (ref 6.5–8.1)

## 2020-08-12 LAB — SALICYLATE LEVEL: Salicylate Lvl: <7 mg/dL — ABNORMAL LOW (ref 7.0–30.0)

## 2020-08-12 LAB — RESPIRATORY PANEL BY RT PCR (FLU A&B, COVID)
Influenza A by PCR: NEGATIVE
Influenza B by PCR: NEGATIVE
SARS Coronavirus 2 by RT PCR: NEGATIVE

## 2020-08-12 LAB — ETHANOL: Alcohol, Ethyl (B): <10 mg/dL (ref <10)

## 2020-08-12 LAB — CBC
HCT: 39.9 % (ref 39.0–52.0)
Hemoglobin: 12.6 g/dL — ABNORMAL LOW (ref 13.0–17.0)
MCH: 26.7 pg (ref 26.0–34.0)
MCHC: 31.6 g/dL (ref 30.0–36.0)
MCV: 84.5 fL (ref 80.0–100.0)
Platelets: 212 10*3/uL (ref 150–400)
RBC: 4.72 MIL/uL (ref 4.22–5.81)
RDW: 13.4 % (ref 11.5–15.5)
WBC: 4.4 10*3/uL (ref 4.0–10.5)
nRBC: 0 % (ref 0.0–0.2)

## 2020-08-12 NOTE — ED Notes (Signed)
Pt dressed out with Brayton Caves, ed tech, this rn and kendall, rn. The following items placed in one of one labeled bag: black sneakers, white socks, black hoodie, grey shorts, teal underwear, 3-1 dollar bill, [employer] card.

## 2020-08-12 NOTE — ED Provider Notes (Signed)
[facility] Emergency Department Provider Note ____________________________________________   First MD Initiated Contact with Patient 08/12/20 2125     (approximate)  I have reviewed the triage vital signs and the nursing notes.  HISTORY  Chief Complaint Suicidal   HPI Larry Walton is a 24 y.o. malewho presents to the ED for evaluation of suicidal thoughts.  Chart review indicates history of schizoaffective disorder.  Patient reports 1 year of depressive symptoms, acutely worsening over the past 1 week with development of suicidality.  Patient denies a plan and denies previous suicidal attempts.  He reports occasional hallucinations, denies command auditory hallucinations.  Denies homicidality.  Denies recent medical illnesses.  Reports being homeless, bouncing around homeless shelters in the streets.  Denies recent assault or trauma in the past 2 weeks.    No past medical history on file.  Patient Active Problem List   Diagnosis Date Noted  . Schizoaffective disorder ([facility]) 08/13/2018    Past Surgical History:  Procedure Laterality Date  . ARTHROSCOPIC REPAIR ACL     left    Prior to Admission medications   Medication Sig Start Date End Date Taking? Authorizing Provider  paliperidone (INVEGA SUSTENNA) 156 MG/ML SUSY injection Inject 1 mL (156 mg total) into the muscle once for 1 dose. Next dose due 09/11/18 09/11/18 09/11/18  McNew, Ileene Hutchinson, MD    Allergies Patient has no known allergies.  No family history on file.  Social History Social History   Tobacco Use  . Smoking status: Never Smoker  . Smokeless tobacco: Never Used  Substance Use Topics  . Alcohol use: No  . Drug use: No    Review of Systems  Constitutional: No fever/chills Eyes: No visual changes. ENT: No sore throat. Cardiovascular: Denies chest pain. Respiratory: Denies shortness of breath. Gastrointestinal: No abdominal pain.  No nausea, no vomiting.  No diarrhea.  No  constipation. Genitourinary: Negative for dysuria. Musculoskeletal: Negative for back pain. Skin: Negative for rash. Neurological: Negative for headaches, focal weakness or numbness. Psychiatric: Positive for suicidality  ____________________________________________   PHYSICAL EXAM:  VITAL SIGNS: Vitals:   08/12/20 2059 08/12/20 2100  BP:  131/74  Pulse: (!) 55   Resp: 12   Temp: 98.5 F (36.9 C)   SpO2: 96%       Constitutional: Alert and oriented. Well appearing and in no acute distress. Eyes: Conjunctivae are normal. PERRL. EOMI. Head: Atraumatic. Nose: No congestion/rhinnorhea. Mouth/Throat: Mucous membranes are moist.  Oropharynx non-erythematous. Neck: No stridor. No cervical spine tenderness to palpation. Cardiovascular: Normal rate, regular rhythm. Grossly normal heart sounds.  Good peripheral circulation. Respiratory: Normal respiratory effort.  No retractions. Lungs CTAB. Gastrointestinal: Soft , nondistended, nontender to palpation. No abdominal bruits. No CVA tenderness. Musculoskeletal: No lower extremity tenderness nor edema.  No joint effusions. No signs of acute trauma. Neurologic:  Normal speech and language. No gross focal neurologic deficits are appreciated. No gait instability noted. Skin:  Skin is warm, dry and intact. No rash noted. Psychiatric: Flat affect and linear thought processes.  ____________________________________________   LABS (all labs ordered are listed, but only abnormal results are displayed)  Labs Reviewed  COMPREHENSIVE METABOLIC PANEL - Abnormal; Notable for the following components:      Result Value   Glucose, Bld 102 (*)    All other components within normal limits  SALICYLATE LEVEL - Abnormal; Notable for the following components:   Salicylate Lvl <7.0 (*)    All other components within normal limits  ACETAMINOPHEN  LEVEL - Abnormal; Notable for the following components:   Acetaminophen (Tylenol), Serum <10 (*)    All  other components within normal limits  CBC - Abnormal; Notable for the following components:   Hemoglobin 12.6 (*)    All other components within normal limits  RESPIRATORY PANEL BY RT PCR (FLU A&B, COVID)  ETHANOL  URINE DRUG SCREEN, QUALITATIVE ([facility] ONLY)   ________________________________________   PROCEDURES and INTERVENTIONS  Procedure(s) performed (including Critical Care):  Procedures  Medications - No data to display  ____________________________________________   MDM / ED COURSE  24 year old male with history of schizoaffective disorder presenting with suicidality requiring IVC and psychiatric consultation.  Normal vital signs on room air.  Exam demonstrates flat affect both linear thought processes and the patient has no signs of acute trauma or distress.  Work-up is benign and patient medically clear for psychiatric consultation and evaluation.  Clinical Course as of Aug 13 2251  Fri Aug 12, 2020  2251 The patient has been placed in psychiatric observation due to the need to provide a safe environment for the patient while obtaining psychiatric consultation and evaluation, as well as ongoing medical and medication management to treat the patient's condition. The patient has been placed under full IVC at this time.     [DS]    Clinical Course User Index [DS] Delton Prairie, MD     ____________________________________________   FINAL CLINICAL IMPRESSION(S) / ED DIAGNOSES  Final diagnoses:  Suicidal thoughts     ED Discharge Orders    None       Shanee Batch Katrinka Blazing   Note:  This document was prepared using Dragon voice recognition software and may include unintentional dictation errors.   Delton Prairie, MD 08/12/20 2253

## 2020-08-12 NOTE — ED Notes (Signed)
Patient reports having thoughts of self harm since yesterday, denies plan at this time.  Patient reports he just can't think of any other way through his problems but does not elaborate even with questioning.  Patient does attempt to attempting to harm self approximately 2 months ago but did not seek help at that time.  Patient also admits to cannabis usage, denies other substances.  Patient denies any type of court dates.

## 2020-08-12 NOTE — ED Notes (Signed)
TTS and nurse practitioner in with patient.

## 2020-08-12 NOTE — ED Provider Notes (Signed)
----------------------------------------- °  11:17 PM on 08/12/2020 -----------------------------------------  Patient was evaluated by psychiatric NP who recommends inpatient admission.   ----------------------------------------- 2:04 AM on 08/13/2020 -----------------------------------------  [facility] and should be transported in the morning.   Emergency Medicine Observation Re-evaluation Note  Larry Walton is a 24 y.o. male, seen on rounds today.  Pt initially presented to the ED for complaints of Suicidal Currently, the patient is resting, voices no medical complaints.  Physical Exam  BP 131/74    Pulse (!) 55    Temp 98.5 F (36.9 C) (Oral)    Resp 12    Ht 5\' 8"  (1.727 m)    Wt 70.3 kg    SpO2 96%    BMI 23.57 kg/m  Physical Exam General: Resting in no acute distress Cardiac: No cyanosis Lungs: Equal rise and fall Psych: Not agitated  ED Course / MDM  EKG:  Clinical Course as of Aug 13 522  Fri Aug 12, 2020  2251 The patient has been placed in psychiatric observation due to the need to provide a safe environment for the patient while obtaining psychiatric consultation and evaluation, as well as ongoing medical and medication management to treat the patient's condition. The patient has been placed under full IVC at this time.     [DS]    Clinical Course User Index [DS] 2252, MD   I have reviewed the labs performed to date as well as medications administered while in observation.  Recent changes in the last 24 hours include no events overnight.  Plan  Current plan is for transfer to [facility] this morning. Patient is under full IVC at this time.   [facility], MD 08/13/20 [id number]

## 2020-08-12 NOTE — ED Triage Notes (Signed)
Pt states thoughts of killing self that began yesterday. Pt is cooperative, pt states history of SI in past.

## 2020-08-13 MED ORDER — HALOPERIDOL 5 MG PO TABS
5.0000 mg | ORAL_TABLET | Freq: Once | ORAL | Status: AC
  Administered 2020-08-13: 5 mg via ORAL
  Filled 2020-08-13: qty 1

## 2020-08-13 MED ORDER — HALOPERIDOL LACTATE 5 MG/ML IJ SOLN
5.0000 mg | Freq: Once | INTRAMUSCULAR | Status: DC
  Filled 2020-08-13: qty 1

## 2020-08-13 MED ORDER — BENZTROPINE MESYLATE 1 MG PO TABS
0.5000 mg | ORAL_TABLET | ORAL | Status: AC
  Administered 2020-08-13: 0.5 mg via ORAL
  Filled 2020-08-13: qty 1

## 2020-08-13 NOTE — ED Provider Notes (Signed)
-----------------------------------------   11:25 AM on 08/13/2020 -----------------------------------------  Patient is resting calmly.  He is in bed, very pleasant and now conversant.  Denies any hallucinations, feels well and understanding and agreeable with the plan to transfer to [facility], MD 08/13/20 1126

## 2020-08-13 NOTE — ED Provider Notes (Signed)
Vitals:   08/12/20 2059 08/12/20 2100  BP:  131/74  Pulse: (!) 55   Resp: 12   Temp: 98.5 F (36.9 C)   SpO2: 96%     The patient did not wish to exit the bathroom initially, holding his hands over his ears, very paranoid, and very almost catatonic. He did eventually walk of the bathroom but is laying in the corner of his room with his fingers in both of his ears, appears scared and paranoid. He appears almost somewhat catatonic-like or curled up in a ball as though something is going to harm him or attack him. I reviewed his chart, psychiatry has recommended admission, and I suspect that he may be having severe symptoms of auditory hallucinations or paranoia. I have ordered intramuscular Haldol to see if this will help calm his symptoms and have rerequested a psychiatry consult for today when her rounding psychiatrist is available for further recommendations   Sharyn Creamer, MD 08/13/20 [phone]

## 2020-08-13 NOTE — Consult Note (Signed)
[facility] Face-to-Face Psychiatry Consult   Reason for Consult: Psych Evaluation  Referring Physician:  Dr.  Patient Identification: Larry Walton MRN:  [id number] Principal Diagnosis: <principal problem not specified> Diagnosis:  Active Problems:   * No active hospital problems. *   Total Time spent with patient: 1 hour  Subjective:   Larry Walton is a 24 y.o. male patient admitted to [facility]. Per triage nurse: pt states thoughts of killing self that began yesterday. Pt is cooperative, pt states history of SI in past.   HPI:  Larry Walton, 24 y.o., male patient seen face to face by this provider; chart reviewed and consulted with Dr. Lucianne Muss on 08/13/20.  On evaluation Larry Walton reports that he has been suicidal for over a year. But he says tonight it got particularly worst because he wanted hurt himself. He states that he would often hurt himself by scratching, slicing and sometimes choking himself.  He has a history of hospitalization in 2019 for psychosis and bizarre behavior. He was supposed to follow-up with [facility] for a therapist and med management but has not. He not currently being seen by a psychiatrist or a therapist.  He states that he is currently homeless and has not slept in weeks. He reports a stable appetite and get food various ways while living on the street.  He endorses audio hallucinations and visual hallucinations.  He says he hears a woman and man voice but they are not command voices. He states that he sees mosquitos and flies but knows they are hallucinations.  He denies abuse, he denies illicit drug and alcohol use and he denies current legal issues.    During evaluation Larry Walton is laying in bed on approach; he is oriented x 4; calm/cooperative;mood is depressed  with flat affect.  Patient is speaking in a clear tone at moderate volume, and normal pace; with poor eye contact.  His thought process is coherent and relevant and appeared at times to be thought blocking;  Patient still endorses  suicidal/self-harm and denies homicidal ideation.  Patient does not appear psychotic or paranoid at this time.  Patient has remained calm throughout assessment and has answered questions appropriately.   Recommendation:  Psychiatric inpatient hospitalization   Past Psychiatric History: schizoaffective disorder  Risk to Self:  yes Risk to Others:  no Prior Inpatient Therapy:  yes 2019 Prior Outpatient Therapy:  no  Past Medical History: No past medical history on file.  Past Surgical History:  Procedure Laterality Date  . ARTHROSCOPIC REPAIR ACL     left   Family History: No family history on file. Family Psychiatric  History: unknown Social History:  Social History   Substance and Sexual Activity  Alcohol Use No     Social History   Substance and Sexual Activity  Drug Use No    Social History   Socioeconomic History  . Marital status: Single    Spouse name: Not on file  . Number of children: Not on file  . Years of education: Not on file  . Highest education level: Not on file  Occupational History  . Not on file  Tobacco Use  . Smoking status: Never Smoker  . Smokeless tobacco: Never Used  Substance and Sexual Activity  . Alcohol use: No  . Drug use: No  . Sexual activity: Not on file  Other Topics Concern  . Not on file  Social History Narrative  . Not on file   Social Determinants of Health   Financial  Resource Strain:   . Difficulty of Paying Living Expenses: Not on file  Food Insecurity:   . Worried About Programme researcher, broadcasting/film/video in the Last Year: Not on file  . Ran Out of Food in the Last Year: Not on file  Transportation Needs:   . Lack of Transportation (Medical): Not on file  . Lack of Transportation (Non-Medical): Not on file  Physical Activity:   . Days of Exercise per Week: Not on file  . Minutes of Exercise per Session: Not on file  Stress:   . Feeling of Stress : Not on file  Social Connections:   . Frequency of Communication with Friends  and Family: Not on file  . Frequency of Social Gatherings with Friends and Family: Not on file  . Attends Religious Services: Not on file  . Active Member of Clubs or Organizations: Not on file  . Attends Banker Meetings: Not on file  . Marital Status: Not on file   Additional Social History:    Allergies:  No Known Allergies  Labs:  Results for orders placed or performed during the hospital encounter of 08/12/20 (from the past 48 hour(s))  Comprehensive metabolic panel     Status: Abnormal   Collection Time: 08/12/20  9:05 PM  Result Value Ref Range   Sodium 139 135 - 145 mmol/L   Potassium 3.9 3.5 - 5.1 mmol/L   Chloride 105 98 - 111 mmol/L   CO2 27 22 - 32 mmol/L   Glucose, Bld 102 (H) 70 - 99 mg/dL    Comment: Glucose reference range applies only to samples taken after fasting for at least 8 hours.   BUN 14 6 - 20 mg/dL   Creatinine, Ser 3.54 0.61 - 1.24 mg/dL   Calcium 9.0 8.9 - 65.6 mg/dL   Total Protein 7.5 6.5 - 8.1 g/dL   Albumin 4.5 3.5 - 5.0 g/dL   AST 29 15 - 41 U/L   ALT 21 0 - 44 U/L   Alkaline Phosphatase 75 38 - 126 U/L   Total Bilirubin 0.4 0.3 - 1.2 mg/dL   GFR, Estimated >81 >27 mL/min   Anion gap 7 5 - 15    Comment: Performed at [facility], [address], [place], [place] [address]  Ethanol     Status: None   Collection Time: 08/12/20  9:05 PM  Result Value Ref Range   Alcohol, Ethyl (B) <10 <10 mg/dL    Comment: (NOTE) Lowest detectable limit for serum alcohol is 10 mg/dL.  For medical purposes only. Performed at [facility], [address] [place], [place] [address]   Salicylate level     Status: Abnormal   Collection Time: 08/12/20  9:05 PM  Result Value Ref Range   Salicylate Lvl <7.0 (L) 7.0 - 30.0 mg/dL    Comment: Performed at [facility], [address] [place], [place] [address]  Acetaminophen level     Status: Abnormal   Collection Time: 08/12/20  9:05 PM  Result Value Ref Range    Acetaminophen (Tylenol), Serum <10 (L) 10 - 30 ug/mL    Comment: (NOTE) Therapeutic concentrations vary significantly. A range of 10-30 ug/mL  may be an effective concentration for many patients. However, some  are best treated at concentrations outside of this range. Acetaminophen concentrations >150 ug/mL at 4 hours after ingestion  and >50 ug/mL at 12 hours after ingestion are often associated with  toxic reactions.  Performed at [facility], [address] [place], [place] [address]   cbc     Status: Abnormal   Collection Time: 08/12/20  9:05 PM  Result Value Ref Range   WBC 4.4 4.0 - 10.5 K/uL   RBC 4.72 4.22 - 5.81 MIL/uL   Hemoglobin 12.6 (L) 13.0 - 17.0 g/dL   HCT 60.439.9 39 - 52 %   MCV 84.5 80.0 - 100.0 fL   MCH 26.7 26.0 - 34.0 pg   MCHC 31.6 30.0 - 36.0 g/dL   RDW 54.013.4 98.111.5 - 19.115.5 %   Platelets 212 150 - 400 K/uL   nRBC 0.0 0.0 - 0.2 %    Comment: Performed at [facility], [address] [place], [place] [address]  Respiratory Panel by RT PCR (Flu A&B, Covid) - Nasopharyngeal Swab     Status: None   Collection Time: 08/12/20 11:02 PM   Specimen: Nasopharyngeal Swab  Result Value Ref Range   SARS Coronavirus 2 by RT PCR NEGATIVE NEGATIVE    Comment: (NOTE) SARS-CoV-2 target nucleic acids are NOT DETECTED.  The SARS-CoV-2 RNA is generally detectable in upper respiratoy specimens during the acute phase of infection. The lowest concentration of SARS-CoV-2 viral copies this assay can detect is 131 copies/mL. A negative result does not preclude SARS-Cov-2 infection and should not be used as the sole basis for treatment or other patient management decisions. A negative result may occur with  improper specimen collection/handling, submission of specimen other than nasopharyngeal swab, presence of viral mutation(s) within the areas targeted by this assay, and inadequate number of viral copies (<131 copies/mL). A negative result must be combined with  clinical observations, patient history, and epidemiological information. The expected result is Negative.  Fact Sheet for Patients:  https://www.moore.com/https://www.fda.gov/media/142436/download  Fact Sheet for Healthcare Providers:  https://www.young.biz/https://www.fda.gov/media/142435/download  This test is no t yet approved or cleared by the [place] FDA and  has been authorized for detection and/or diagnosis of SARS-CoV-2 by FDA under an Emergency Use Authorization (EUA). This EUA will remain  in effect (meaning this test can be used) for the duration of the COVID-19 declaration under Section 564(b)(1) of the Act, 21 U.S.C. section 360bbb-3(b)(1), unless the authorization is terminated or revoked sooner.     Influenza A by PCR NEGATIVE NEGATIVE   Influenza B by PCR NEGATIVE NEGATIVE    Comment: (NOTE) The Xpert Xpress SARS-CoV-2/FLU/RSV assay is intended as an aid in  the diagnosis of influenza from Nasopharyngeal swab specimens and  should not be used as a sole basis for treatment. Nasal washings and  aspirates are unacceptable for Xpert Xpress SARS-CoV-2/FLU/RSV  testing.  Fact Sheet for Patients: https://www.moore.com/https://www.fda.gov/media/142436/download  Fact Sheet for Healthcare Providers: https://www.young.biz/https://www.fda.gov/media/142435/download  This test is not yet approved or cleared by the [place] FDA and  has been authorized for detection and/or diagnosis of SARS-CoV-2 by  FDA under an Emergency Use Authorization (EUA). This EUA will remain  in effect (meaning this test can be used) for the duration of the  Covid-19 declaration under Section 564(b)(1) of the Act, 21  U.S.C. section 360bbb-3(b)(1), unless the authorization is  terminated or revoked. Performed at [facility], [address] [place], [place] [address]     No current facility-administered medications for this encounter.   Current Outpatient Medications  Medication Sig Dispense Refill  . paliperidone (INVEGA SUSTENNA) 156 MG/ML SUSY  injection Inject 1 mL (156 mg total) into the muscle once for 1 dose. Next dose due 09/11/18 1 mL  1    Musculoskeletal: Strength & Muscle Tone: within normal limits Gait & Station: normal Patient leans: N/A  Psychiatric Specialty Exam: Physical Exam  Review of Systems  Blood pressure 131/74, pulse (!) 55, temperature 98.5 F (36.9 C), temperature source Oral, resp. rate 12, height 5\' 8"  (1.727 m), weight 70.3 kg, SpO2 96 %.Body mass index is 23.57 kg/m.  General Appearance: Casual  Eye Contact:  Minimal  Speech:  Clear and Coherent  Volume:  Decreased  Mood:  Depressed and Dysphoric  Affect:  Flat  Thought Process:  Coherent and Descriptions of Associations: Intact  Orientation:  Full (Time, Place, and Person)  Thought Content:  Hallucinations: Auditory Visual  Suicidal Thoughts:  Yes.  without intent/plan  Homicidal Thoughts:  No  Memory:  Recent;   Fair  Judgement:  Impaired  Insight:  Lacking  Psychomotor Activity:  Normal  Concentration:  Attention Span: Fair  Recall:  of Knowledge:  Fair  Language:  Fair  Akathisia:  NA  Handed:  Right  AIMS (if indicated):     Assets:  Desire for Improvement Resilience  ADL's:  Intact  Cognition:  WNL  Sleep:   States he hasnt slept in weeks     Treatment Plan Summary: Daily contact with patient to assess and evaluate symptoms and progress in treatment and Medication management  Disposition: Recommend psychiatric Inpatient admission when medically cleared. Supportive therapy provided about ongoing stressors. Discussed crisis plan, support from social network, calling 911, coming to the Emergency Department, and calling Suicide Hotline.  [employer], NP 08/13/2020 12:36 AM

## 2020-08-13 NOTE — ED Notes (Signed)
Belongings bag 1/1 given to transport.

## 2020-08-13 NOTE — BH Assessment (Signed)
Referral information for Psychiatric Hospitalization faxed to;   . [facility] ([phone], [phone], [phone] or [phone]),   . [facility] ([phone] or [phone])  . [facility] ([phone]),   . [facility] ([phone] -or- [phone]),     

## 2020-08-13 NOTE — BH Assessment (Signed)
Assessment Note  Larry Walton is an 24 y.o. male presenting to [facility] ED initially voluntary but has since been IVC'd by ER attending doctor. Per triage note Pt states thoughts of killing self that began yesterday. Pt is cooperative, pt states history of SI in past. During assessment patient is alert and oriented x4, cooperative, appears depressed, affect is depressed sad and flat, speech is soft but coherent. When asked why patient is presenting to the ED patient reported "I'm having suicidal thoughts I've been having them for over a year." Patient reported what was different today for him to come into the ED "because I was caused into action, I was going to overdue myself and I felt like I was going to hurt myself." Patient currently denies any plan. Patient does however report some self harm "I scratch myself and strangle myself." Patient denies HI but reports a history of AH and VH. Patient reports his AH "I hear a man and woman, but they don't tell me to hurt myself." Patient reports VH "I see spiders and bugs." Patient reports he does not currently have a psychiatrist and is not currently taking any medications. Patient does report he is currently homeless and has been "for 1 or 2 years." Patient does not report any current support available to him. Patient has 1 prior hospitalization with [facility] 08/2018 and was diagnosed Schizoaffective Disorder. Patient continues to report SI with not plan, denies current HI/AH/VH, patient at times had some thought blocking and would pause while being asked questions, patient may be experiencing some internal stimuli.   Per Psyc NP Lerry Liner patient is recommended for Inpatient Hospitalization   Diagnosis: Schizoaffective Disorder.  Past Medical History: No past medical history on file.  Past Surgical History:  Procedure Laterality Date  . ARTHROSCOPIC REPAIR ACL     left    Family History: No family history on file.  Social History:  reports that he has  never smoked. He has never used smokeless tobacco. He reports that he does not drink alcohol and does not use drugs.  Additional Social History:  Alcohol / Drug Use Pain Medications: See MAR Prescriptions: See MAR Over the Counter: See MAR History of alcohol / drug use?: Yes Substance #1 Name of Substance 1: Marijuana  CIWA: CIWA-Ar BP: 131/74 Pulse Rate: (!) 55 COWS:    Allergies: No Known Allergies  Home Medications: (Not in a hospital admission)   OB/GYN Status:  No LMP for male patient.  General Assessment Data Location of Assessment: [facility] ED TTS Assessment: In system Is this a Tele or Face-to-Face Assessment?: Face-to-Face Is this an Initial Assessment or a Re-assessment for this encounter?: Initial Assessment Patient Accompanied by:: N/A Language Other than English: No Living Arrangements: Homeless/Shelter What gender do you identify as?: Male Marital status: Single Living Arrangements: Other (Comment) (Homeless) Can pt return to current living arrangement?: Yes Admission Status: Involuntary Petitioner: ED Attending Is patient capable of signing voluntary admission?: No Referral Source: Self/Family/Friend Insurance type: None  Medical Screening Exam [facility] Walk-in ONLY) Medical Exam completed: Yes  Crisis Care Plan Living Arrangements: Other (Comment) (Homeless) Legal Guardian: Other: (Self) Name of Psychiatrist: None Name of Therapist: None  Education Status Is patient currently in school?: No Is the patient employed, unemployed or receiving disability?: Unemployed  Risk to self with the past 6 months Suicidal Ideation: Yes-Currently Present Has patient been a risk to self within the past 6 months prior to admission? : Yes Suicidal Intent: Yes-Currently Present Has patient  had any suicidal intent within the past 6 months prior to admission? : Yes Is patient at risk for suicide?: Yes Suicidal Plan?: No Has patient had any suicidal plan within the past 6  months prior to admission? : No Access to Means: No What has been your use of drugs/alcohol within the last 12 months?: Marijuana Previous Attempts/Gestures: No How many times?: 0 Other Self Harm Risks: Patient reports scrathing himself and strangling himself Triggers for Past Attempts: None known Intentional Self Injurious Behavior:  (Scratching, Strangling himself) Family Suicide History: No Recent stressful life event(s): Other (Comment), Financial Problems (Currently homeless) Persecutory voices/beliefs?: No Depression: Yes Depression Symptoms: Insomnia, Tearfulness, Isolating, Loss of interest in usual pleasures, Feeling worthless/self pity Substance abuse history and/or treatment for substance abuse?: No Suicide prevention information given to non-admitted patients: Not applicable  Risk to Others within the past 6 months Homicidal Ideation: No Does patient have any lifetime risk of violence toward others beyond the six months prior to admission? : No Thoughts of Harm to Others: No Current Homicidal Intent: No Current Homicidal Plan: No Access to Homicidal Means: No Identified Victim: None History of harm to others?: No Assessment of Violence: None Noted Violent Behavior Description: None Does patient have access to weapons?: No Criminal Charges Pending?: No Does patient have a court date: No Is patient on probation?: No  Psychosis Hallucinations: Auditory, Visual Delusions: None noted  Mental Status Report Appearance/Hygiene: In scrubs Eye Contact: Fair Motor Activity: Freedom of movement Speech: Logical/coherent Level of Consciousness: Alert Mood: Depressed Affect: Depressed, Sad Anxiety Level: None Thought Processes: Coherent Judgement: Unimpaired Orientation: Person, Place, Time, Situation, Appropriate for developmental age Obsessive Compulsive Thoughts/Behaviors: None  Cognitive Functioning Concentration: Normal Memory: Recent Intact, Remote Intact Is  patient IDD: No Insight: Fair Impulse Control: Fair Appetite: Poor Have you had any weight changes? : No Change Sleep: Decreased Total Hours of Sleep: 0 Vegetative Symptoms: None  ADLScreening [facility] Assessment Services) Patient's cognitive ability adequate to safely complete daily activities?: Yes Patient able to express need for assistance with ADLs?: Yes Independently performs ADLs?: Yes (appropriate for developmental age)  Prior Inpatient Therapy Prior Inpatient Therapy: Yes Prior Therapy Dates: 08/05/2018 Prior Therapy Facilty/Provider(s): [facility] Reason for Treatment: Schizoaffective Disorder  Prior Outpatient Therapy Prior Outpatient Therapy: No Does patient have an ACCT team?: No Does patient have Intensive In-House Services?  : No Does patient have [facility] services? : No Does patient have P4CC services?: No  ADL Screening (condition at time of admission) Patient's cognitive ability adequate to safely complete daily activities?: Yes Is the patient deaf or have difficulty hearing?: No Does the patient have difficulty seeing, even when wearing glasses/contacts?: No Does the patient have difficulty concentrating, remembering, or making decisions?: No Patient able to express need for assistance with ADLs?: Yes Does the patient have difficulty dressing or bathing?: No Independently performs ADLs?: Yes (appropriate for developmental age) Does the patient have difficulty walking or climbing stairs?: No Weakness of Legs: None Weakness of Arms/Hands: None  Home Assistive Devices/Equipment Home Assistive Devices/Equipment: None  Therapy Consults (therapy consults require a physician order) PT Evaluation Needed: No OT Evalulation Needed: No SLP Evaluation Needed: No Abuse/Neglect Assessment (Assessment to be complete while patient is alone) Abuse/Neglect Assessment Can Be Completed: Yes Physical Abuse: Denies Verbal Abuse: Denies Sexual Abuse: Denies Exploitation of  patient/patient's resources: Denies Self-Neglect: Denies Values / Beliefs Cultural Requests During Hospitalization: None Spiritual Requests During Hospitalization: None Consults Spiritual Care Consult Needed: No Transition of Care Team Consult  Needed: No            Disposition: Per Psyc NP Lodema Pilot Dixon patient is recommended for Inpatient Hospitalization Disposition Initial Assessment Completed for this Encounter: Yes  On Site Evaluation by:   Reviewed with Physician:    Benay Pike MS LCASA 08/13/2020 1:17 AM

## 2020-08-13 NOTE — ED Provider Notes (Signed)
Offered oral or intramuscular medication, he declined these. He does not appear to represent an imminent harm to himself or others, thus will discontinue and await recommendation from rounding psychiatrist   Sharyn Creamer, MD 08/13/20 [phone]

## 2020-08-13 NOTE — BH Assessment (Signed)
PATIENT BED AVAILABLE AFTER 9AM ON 08/13/20  Patient has been accepted to [facility].  Patient assigned to [facility] A-Unit Accepting physician is Dr. Sallyanne Kuster.  Call report to [phone].  Representative was [place].   ER Staff is aware of it:  [facility] ER Secretary  Dr. Dolores Frame, ER MD  [facility] Patient's Nurse     Address: [address] [place] [address] Patient must check-in at the [facility]

## 2020-08-13 NOTE — ED Notes (Signed)
Pt up to the restroom

## 2020-08-13 NOTE — ED Notes (Signed)
Pt given breakfast tray
